# Patient Record
Sex: Female | Born: 1970 | Hispanic: Yes | Marital: Single | Smoking: Never smoker
Health system: Southern US, Community
[De-identification: ages and names within clinical notes are randomized; demographics above are authoritative.]

## PROBLEM LIST (undated history)

## (undated) DIAGNOSIS — Z9889 Other specified postprocedural states: Secondary | ICD-10-CM

## (undated) DIAGNOSIS — E785 Hyperlipidemia, unspecified: Secondary | ICD-10-CM

## (undated) DIAGNOSIS — D649 Anemia, unspecified: Secondary | ICD-10-CM

## (undated) DIAGNOSIS — D259 Leiomyoma of uterus, unspecified: Secondary | ICD-10-CM

## (undated) DIAGNOSIS — N920 Excessive and frequent menstruation with regular cycle: Secondary | ICD-10-CM

## (undated) DIAGNOSIS — N9489 Other specified conditions associated with female genital organs and menstrual cycle: Secondary | ICD-10-CM

## (undated) DIAGNOSIS — R7989 Other specified abnormal findings of blood chemistry: Secondary | ICD-10-CM

## (undated) DIAGNOSIS — Z8742 Personal history of other diseases of the female genital tract: Secondary | ICD-10-CM

## (undated) DIAGNOSIS — R112 Nausea with vomiting, unspecified: Secondary | ICD-10-CM

## (undated) HISTORY — DX: Hyperlipidemia, unspecified: E78.5

## (undated) HISTORY — PX: BOWEL RESECTION: SHX1257

## (undated) HISTORY — PX: TUBAL LIGATION: SHX77

## (undated) HISTORY — PX: KNEE SURGERY: SHX244

## (undated) HISTORY — PX: SHOULDER SURGERY: SHX246

---

## 2011-11-05 ENCOUNTER — Ambulatory Visit (INDEPENDENT_AMBULATORY_CARE_PROVIDER_SITE_OTHER): Payer: BC Managed Care – PPO | Admitting: Gynecology

## 2011-11-05 ENCOUNTER — Other Ambulatory Visit (HOSPITAL_COMMUNITY)
Admission: RE | Admit: 2011-11-05 | Discharge: 2011-11-05 | Disposition: A | Discharge status: Home / Self Care | Payer: BC Managed Care – PPO | Source: Ambulatory Visit | Attending: Gynecology | Admitting: Gynecology

## 2011-11-05 ENCOUNTER — Encounter: Payer: Self-pay | Admitting: Gynecology

## 2011-11-05 VITALS — BP 118/76 | Ht 58.25 in | Wt 157.0 lb

## 2011-11-05 DIAGNOSIS — N949 Unspecified condition associated with female genital organs and menstrual cycle: Secondary | ICD-10-CM

## 2011-11-05 DIAGNOSIS — IMO0002 Reserved for concepts with insufficient information to code with codable children: Secondary | ICD-10-CM

## 2011-11-05 DIAGNOSIS — N941 Unspecified dyspareunia: Secondary | ICD-10-CM | POA: Insufficient documentation

## 2011-11-05 DIAGNOSIS — R635 Abnormal weight gain: Secondary | ICD-10-CM

## 2011-11-05 DIAGNOSIS — Z01419 Encounter for gynecological examination (general) (routine) without abnormal findings: Secondary | ICD-10-CM | POA: Insufficient documentation

## 2011-11-05 DIAGNOSIS — N938 Other specified abnormal uterine and vaginal bleeding: Secondary | ICD-10-CM

## 2011-11-05 DIAGNOSIS — Z23 Encounter for immunization: Secondary | ICD-10-CM

## 2011-11-05 DIAGNOSIS — N898 Other specified noninflammatory disorders of vagina: Secondary | ICD-10-CM

## 2011-11-05 DIAGNOSIS — Z1151 Encounter for screening for human papillomavirus (HPV): Secondary | ICD-10-CM | POA: Insufficient documentation

## 2011-11-05 DIAGNOSIS — Z833 Family history of diabetes mellitus: Secondary | ICD-10-CM | POA: Insufficient documentation

## 2011-11-05 LAB — CBC WITH DIFFERENTIAL/PLATELET
Basophils Relative: 1 % (ref 0–1)
Eosinophils Absolute: 0.8 10*3/uL — ABNORMAL HIGH (ref 0.0–0.7)
MCH: 24.6 pg — ABNORMAL LOW (ref 26.0–34.0)
MCHC: 33.7 g/dL (ref 30.0–36.0)
Neutro Abs: 5.8 10*3/uL (ref 1.7–7.7)
Neutrophils Relative %: 58 % (ref 43–77)
Platelets: 374 10*3/uL (ref 150–400)
RBC: 4.99 MIL/uL (ref 3.87–5.11)

## 2011-11-05 LAB — HEMOGLOBIN A1C
Hgb A1c MFr Bld: 5.9 % — ABNORMAL HIGH (ref <5.7)
Mean Plasma Glucose: 123 mg/dL — ABNORMAL HIGH (ref <117)

## 2011-11-05 MED ORDER — FLUCONAZOLE 100 MG PO TABS: ORAL_TABLET | ORAL | Status: DC

## 2011-11-05 NOTE — Patient Instructions (Addendum)
Vacuna difteria/ttanos (Td) o [place] difteria, ttanos, tos convulsa (Tdap), Lo que debe saber (Tetanus, Diphtheria [Td] or Tetanus, Diphtheria, Pertussis [Tdap] Vaccine, What You Need to Know) PORQU VACUNARSE? El ttanos , la difteria y la tos ferina pueden ser enfermedades graves.  El TTANOS  (trismo) provoca la contraccin dolorosa y rigidez de los msculos, por lo general, en todo el cuerpo.   Puede causar la contraccin de los msculos de la cabeza y el cuello de modo que el enfermo no puede abrir la boca ni tragar., y en algunos casos, tampoco puede respirar.. El ttanos causa la muerte de 1 de cada 5 personas que se infectan.  LA DIFTERIA produce la formacin de una membrana gruesa que cubre el fondo de la garganta.  Puede causar problemas respiratorios, parlisis, insuficiencia cardaca, e incluso la muerte.  El PERTUSIS (tos [place]) causa ataques de tos intensa que pueden dificultar la respiracin, provocar vmitos e interrumpir el sueo.   Puede causar prdida de peso, incontinencia, fractura de [place], y desmayos por la intensa tos. Hasta de 2 de cada 100 adolescentes y 5 de cada 100 adultos que enferman de tos [place] deben ser hospitalizados o tienen complicaciones como la neumona y la [place].  Estas 3 enfermedades son provocadas por bacterias. La difteria y la tos Benetta Spar se [place] de persona a Social worker. El ttanos ingresa al organismo a travs de cortes, rasguos o heridas. En los [place] ocurran alrededor de 200 000 casos por ao de difteria y tos [place], antes de que existieran las [place], y tambin ocurran cientos de casos de ttanos. Desde la aparicin de las vacunas, el ttanos y la difteria han disminuido en alrededor del 99% y los casos de tos ferina disminuyeron aproximadamente el 92%.  Los nios menores de 6 aos deben recibir la vacuna DTaP para estar protegidos contra estas tres enfermedades. Pero los [employer], los adolescentes y los adultos tambin  necesitan proteccin. VACUNAS PARA ADOLESCENTES Y ADULTOS Vacunas Tdap y Td  Hay dos vacunas disponibles para proteger de estas enfermedades a nios a Glass blower/designer de los 7aos:   La vacuna Td fue utilizada durante muchos aos. Protege contra el ttanos y la difteria.   La vacuna Tdap fue autorizada en 2005. Es la primera vacuna para adolescentes y adultos que protege contra la tos ferina y el ttanos y la difteria.  Una dosis de refuerzo de la Td se recomienda cada 10 aos. La Tdap se aplica slo una vez.  QU VACUNA DEBO APLICARME Y CUANDO? Las edades de 7 a 18 aos  Dynegy 11 y los 12 aos se recomienda una dosis de Tdap. Esta dosis puede aplicarse desde los 7 aos en los nios que no han recibido una o ms dosis de DTaP anteriormente.   Los nios y adolescentes que no recibieron todas las dosis programadas de DTaP o DTP a los 7 aos deben completar la serie usando una combinacin de Td y Tdap.  Adultos de 19 aos o ms  [employer] adultos deben recibir una dosis de refuerzo de Td cada 10 aos. Los adultos de menos de 65 aos que nunca hayan recibido la Tdap deben reemplazarla por la siguiente dosis de refuerzo. Los adultos a partir de los 65 aos puedenrecibir una dosis de Tdap.   Los adultos (incluyendo las mujeres que podran quedar embarazadas y los adultos mayores de 65 aos) que tienen contacto cercano con un beb menor de 12 meses deben aplicarse una dosis de Tdap para proteger al beb  de la tos [place].   Los trabajadores de la salud que tengan contacto directo con pacientes en hospitales o clnicas deben recibir una dosis de Tdap.  Proteccin despus de [place] herida  Es posible que una persona que tenga un corte o quemadura grave necesite una dosis de Td o Tdap para prevenir la infeccin por ttanos. Puede usarse la Tdap en personas que nunca recibieron una dosis. Pero debe usarse la Td, si la Tdap no se encuentra disponible, o para:   Cualquier persona que haya recibido una dosis de  Tdap.   Los nios [employer] 7 y los 9 aos que han [employer] series de DTap anteriormente.   Adultos de 65 aos o ms.  Mujeres embarazadas.   Las mujeres embarazadas que nunca recibieron una dosis de Ddap deben recibirla despus de la 20a semana de gestacin y preferiblemente durante Contractor. trimestre. Si no se aplican la Tdap durante el embarazo, deben recibirla lo antes posible despus del parto. Las mujeres embarazadas que han recibido la Tdap y tienen que aplicarse la vacuna contra el ttanos o la difteria durante el [place], deben recibir la Td.  Las vacunas Tdap y Td pueden ser administradas al mismo tiempo que otras vacunas. ALGUNAS PERSONAS NO DEBEN RECIBIR LA VACUNA O DEBEN [employer]  Las personas que hayan tenido una reaccin alrgica que haya puesto en peligro su vida despus de una dosis de vacuna contra el ttanos, la difteria o la tos ferina no deben recibir Td ni Tdap..   Las personas que tengan alergias graves a algn componente de una vacuna no deben recibir esa vacuna. Informe a su mdico si la persona que recibe la vacuna sufre alergias graves.   Cualquier persona que [employer] Companies en coma o que haya tenido convulsiones dentro de los 7 [address]  [address] posteriores despus de una dosis de DTP o DTaP no debe recibir la Tdap, salvo que se encuentre una causa que no fuera la vacuna. Estas personas pueden recibir Td.   Consulte a su mdico si la persona que recibe [place] de las vacunas:   Tiene epilepsia o algn otro problema del sistema nervioso.   Tuvo inflamacin o dolor intenso despus de una dosis de DTP, DTaP, DT, Td, o Tdap.   Ha tenido el sndrome de Scientific laboratory technician (GBS por sus siglas en ingls).  Las personas que sufran una enfermedad moderada o grave el da en que se programa la vacuna, deben esperar a recuperarse para recibir las vacunas Tdap o Td. Por lo general, una persona con una enfermedad leve o fiebre baja puede recibir la vacuna. CULES SON LOS RIESGOS DE LAS VACUNAS  TDAP Y TD? Con Cathleen Corti, al igual que con cualquier [employer], siempre existe un pequeo riesgo de una reaccin alrgica que ponga en peligro la vida o cause otro problema grave. Todo procedimiento mdico, inclusive la vacunacin pueden causar breves episodios de lipotimia o sntomas relacionados (como movimientos espasmdicos). Para evitar los [employer] y las lesiones causadas por las cadas, permanezca sentado o recustese durante los 15 minutos posteriores a la vacunacin. Informe a su mdico si el paciente se siente dbil o mareado, tiene cambios en la visin o siente zumbidos en los odos.  Es mucho ms probable que tener ttanos, difteria, o tos ferina cause problemas ms graves que los provocados por recibir cualquiera de las vacunas Td o Tdap. A continuacin se enumeran los problemas informados despus de las vacunas Td y Tdap. Problemas Leves (perceptibles, pero que no interfirieron con las  actividades): Tdap  Dolor (alrededor de 3 de cada 4 adolescentes y 2 de cada 3 adultos).   Enrojecimiento o inflamacin en el sitio de la inyeccin (alrededor de 1 de cada 5).   Fiebre leve de al menos 100.4 F (38 C) (hasta alrededor de 1 cada 25 adolescentes y 1 de cada 100 adultos).   Dolor de cabeza (alrededor de 4 de cada 10 adolescentes y 3 de cada 10 adultos).   Cansancio (alrededor de 1 de cada 3 adolescentes y 1 de cada 4 adultos).   Nuseas, vmitos, diarrea, o dolor de estmago (hasta 1 de cada 4 adolescentes y 1 de cada 10 adultos).   Escalofros, dolores corporales, dolor articular, erupciones, o inflamacin de las glndulas (poco frecuente).  Td  Dolor (hasta alrededor de 8 de cada 10).   Enrojecimiento o inflamacin de la inyeccin (alrededor de 1 de cada 3).   Fiebre leve (hasta alrededor de 1 de cada 5).   Dolor de cabeza o cansancio (poco frecuente).  Problemas Moderados (interfieren con las [place], [place] no requieren atencin mdica): Tdap  Dolor en el sitio de  la inyeccin (alrededor de 1 de cada 20 adolescentes y 1 de cada 100 adultos).   Enrojecimiento o inflamacin de la inyeccin (alrededor de 1 de cada 16 adolescentes y 1 de cada 25 adultos).   Fiebre de ms de 102 F (38.9 C) (alrededor de 1 de cada 100 adolescentes y 1 de cada 250 adultos).   Dolor de cabeza (1 de cada 300).   Nuseas, vmitos, diarrea, o dolor de estmago (hasta 3 de cada 100 adolescentes y 1 de cada 100 adultos).  Td  Fiebre de ms de 102 F (38.9 C) (poco comn).  Tdap o Td  Inflamacin de gran extensin en el brazo en el que se aplic la vacuna (hasta 3 de cada 100).  Problemas [place] (no puede realizar [employer]; requiere Psychologist, prison and probation services) Tdap o Td  Inflamacin, dolor intenso, sangrado y enrojecimiento en el brazo, en el sitio de la inyeccin (poco frecuente).  Puede producirse una reaccin alrgica grave despus de cualquier vacuna. Se estima que estas reacciones ocurren en menos de una de cada un milln de dosis. QU PASA SI HAY UNA REACCIN GRAVE? Qu signos debo buscar? Cualquier estado poco habitual, como una reaccin alrgica grave o fiebre alta. Si le produce Runner, broadcasting/film/video grave, se manifestar dentro de algunos minutos a una hora despus de recibir la vacuna. Entre los signos de Automotive engineer grave se encuentran la dificultad para respirar, debilidad, ronquera o sibilancias, latidos cardacos acelerados, urticaria, mareos, palidez, o inflamacin de la garganta. Qu debo hacer?  Comunquese con su mdico o lleve inmediatamente a la persona al mdico.   Dgale a su mdico qu ocurri, la fecha y hora en que sucedi y cundo le aplicaron la vacuna.   Pida a su mdico que informe sobre la reaccin llenando un formulario del Sistema de Informacin de Reacciones Adversos a las Administrator, arts (VAERS, por sus siglas en ingls). O, puede presentar este informe a travs del sitio web de VAERS [email or web address] o puede llamar al  [phone].  VAERS no brinda asistencia mdica. PROGRAMA NACIONAL DE COMPENSACIN DE DAOS POR VACUNAS El [place] de Compensacin de Daos por Vacunas (VICP) fue creado en 1986.  Aquellas personas que consideren que han sufrido un dao como consecuencia de una vacuna y quieren saber ms acerca del programa y como presentar [place], [place] llamar al [phone] o visitar su sitio web  en [email or web address]  CMO PUEDO OBTENER MS INFORMACIN?  El profesional podr darle el prospecto de la vacuna o sugerirle otras fuentes de informacin.   Comunquese con el servicio de salud de su localidad o [address].   Comunquese con los Centros para el control y la prevencin de Child psychotherapist for Disease Control and Prevention , CDC).   Llame al [phone] (1-800-CDC-INFO).   Visite los sitios web de Energy [employer] Partners en [email or web address]  CDC Td and Tdap Interim VIS-Spanish (02/05/10) Document Released: 04/17/2008 Document Revised: 12/19/2010 [facility] Patient Information 2012 [place], [place]. Ecografa transvaginal (Transvaginal Ultrasound) La ecografa transvaginal es una ecografa plvica en la que se utiliza una probeta metlica que se coloca en la vagina, para observar los rganos femeninos. El ecgrafo enva ondas sonoras desde un transductor (sonda). Estas ondas sonoras chocan contra las estructuras del cuerpo (como un eco) y crean Naval architect. La imagen se observa en un monitor. Se denomina transvaginal debido a que la sonda se inserta dentro de la vagina. Puede haber una pequea molestia por la introduccin de la sonda. Esta prueba tambin puede realizarse [employer]. La ecografa endovaginal es otro nombre para la ecografa transvaginal. En una ecografa transabdominal, la sonda se coloca en la parte externa del abdomen. Este mtodo no ofrece imgenes tan buenas como la tcnica transvaginal. La ecogafa transvaginal se utiliza para observar  alteraciones en el tracto genital femenino. Entre ellos se incluyen:  Problemas de infertilidad.   Malformaciones congnitas (defecto de nacimiento) del tero y los ovarios.   Tumores en el tero.   Hemorragias anormales.   Tumores y quistes de ovario.   Abscesos (tejidos inflamados y pus) en la pelvis.   Dolor abdominal o plvico sin causa aparente.   Infecciones plvicas.  DURANTE EL EMBARAZO, SE UTILIZA PAR OBSERVAR:  Embarazos normales.   Un embarazo ectpico (embarazo fuera del tero).   Latidos cardacos fetales.   Anormalidades de la pelvis que no se observan bien con la ecografa transabdominal.   Sospecha de gemelos o embarazo mltiple.   Aborto inminente.   Problemas en el cuello del tero (cuello incompetente, no permanece cerrado para contener al beb).   Cuando se realiza una amniocentesis (se retira lquido de la bolsa [place], para ser [place]).   Al buscar anormalidades en el beb.   Para controlar el crecimiento, el desarrollo y la edad del feto.   Para medir la cantidad de lquido en el saco amnitico.   Cuando se realiza una versin externa del beb (se lo mueve a Loss adjuster, chartered).   Evaluar al beb en embarazos de alto riesgo (perfil biofsico).   Si se sospecha el deceso del beb (muerte).  En algunos casos, se utiliza un mtodo especial denominado ecografa con infusin salina, para una observacin ms precisa del tero. Se inyecta solucin salina (agua con sal) dentro del tero en pacientes no embarazadas para observar mejor su interior. Este mtodo no se Glass blower/designer. La probeta tambin puede usarse para obtener biopsias de Insurance claims handler, para drenar lquido de quistes de ovario y para Editor, commissioning un DIU (dispositivo intrauterino para el control de la natalidad) que no pueda [place]. PREPARACIN PARA LA PRUEBA La ecografa transvaginal se realiza con la vejiga vaca. La ecografa transabdominal se realiza con la vejiga  llena. Podrn solicitarle que beba varios vasos de agua antes del examen. En algunos casos se realiza una ecografa transabdominal antes de la ecografa transvaginal para obervar los rganos del abdomen. PROCEDIMIENTO  Deber acostarse en  una cama, con las rodillas dobladas y los pies en los estribos. La probeta se cubre con un condn. Dentro de la vagina y en la probeta se aplica un lubricante estril. El lubricante ayuda a transmitir las ondas sonoras y Nature conservation officer la irritacin de la vagina. El mdico mover la sonda en el interior de la cavidad vaginal para escanear las estructuras plvicas. Un examen normal mostrar una pelvis normal y contenidos normales en su interior. Una prueba anormal mostrar anormalidades en la pelvis, la placenta o el beb. LAS CAUSAS DE UN RESULTADO ANORMAL PUEDEN SER:  Crecimientos o tumores en:   El tero.   Los ovarios.   La vagina.   Otras estructuras plvicas.   Crecimientos no cancerosos en el tero y los ovarios.   El ovario se retuerce y se corta el suministro de Montez Hageman (torsin [place]).   Las reas de infeccin incluyen:   Enfermedad inflamatoria plvica.   Un absceso en la pelvis.   Ubicacin de un DIU.  LOS PROBLEMAS QUE PUEDEN HALLARSE EN UNA MUJER EMBARAZADA SON:  Embarazo ectpico (embarazo fuera del tero).   Embarazos mltiples.   Dilatacin (apertura) precoz anormal del cuello del tero. Esto puede indicar un cuello incompetente y Surveyor, mining.   Aborto inminente.   Muerte fetal.   Los problemas con la placenta incluyen:   La placenta se ha desarrollado sobre la abertura del cuello del tero (placenta previa).   La placenta se ha separado anticipadamente en el tero (abrupcin placentaria).   La placenta se desarrolla en el msculo del tero (placenta acreta).   Tumores del Psychiatrist, incluyendo la enfermedad trofoblstica gestacional. Se trata de un embarazo anormal en el que no hay feto. El tero se llena de quistes  similares a uvas que en algunos casos son cancerosos.   Posicin incorrecta del feto (de nalgas, de vrtice).   Retraso del desarrollo fetal intrauterino (escaso desarrollo en el tero).   Anormalidades o infeccin fetal.  RIESGOS Y COMPLICACIONES No hay riesgos conocidos para la ecografa. No se toman radiografas cuando se realiza una ecografa. Document Released: 04/17/2008 Document Revised: 12/19/2010 [facility] Patient Information 2012 [place], [place].   Vulvovaginitis Candidisica (Candidal Vulvovaginitis) La vulvovaginitis candidisica es una infeccin de la vagina y la vulva. La vulva es la piel que rodea la abertura de la vagina. Puede causar picazn y [place] de y alrededor de la vagina.  CUIDADOS EN EL HOGAR  Slo tome medicamentos como lo indique su mdico.  No mantenga relaciones sexuales hasta que la infeccin haya curado o segn le indique el mdico.  Practique sexo seguro.  Informe a su compaero sexual acerca de su infeccin.  No tome duchas vaginales ni use tampones.  Use ropa interior de algodn. No utilice pantalones ni pantimedias ajustados.  Coma yogur. Esto puede ayudar a tratar y prevenir las infecciones por cndida. SOLICITE AYUDA DE INMEDIATO SI:   Tiene fiebre.  Los problemas empeoran durante el tratamiento, o si no mejora luego de [address].  Tiene malestar, irritacin, o picazn en la zona de la vagina o la vulva.  Siente dolor en al [employer]  Comienza a sentir dolor abdominal. ASEGRESE DE QUE:  Comprende estas instrucciones.  Controlar su enfermedad.  Solicitar ayuda de inmediato si no mejora o empeora. Document Released: 02/01/2010 Document Revised: 03/24/2011 [facility] Patient Information 2013 [place], [place].

## 2011-11-05 NOTE — Progress Notes (Signed)
Helen Mccarthy 11/26/1970 [id number]   History:    41 y.o.  for annual gyn exam who is new to the practice. Patient states she's had 4 prior cesarean sections the last one resulting in a tubal sterilization procedure. She has not had a Pap smear in over 3 years. She denies any prior history of abnormal Pap smears. Patient with no prior mammograms done. Patient does her monthly self breast examination. Patient complaining of a slight vaginal discharge today. Patient states that over the past year her periods are lasting about 3-4 days comes and and and and then a week or 10 days later she starts having a brownish-like discharge. She also complaining of dyspareunia.  Past medical history,surgical history, family history and social history were all reviewed and documented in the EPIC chart.  Gynecologic History Patient's last menstrual period was 10/12/2011. Contraception: tubal ligation Last Pap: 3 years ago. Results were: Normal? Last mammogram: No prior study. Results were: No prior study  Obstetric History OB History    Grav Para Term Preterm Abortions TAB SAB Ect Mult Living   4 4 4       4      # Outc Date GA Lbr Len/2nd Wgt Sex Del Anes PTL Lv   1 TRM     M CS  No Yes   Comments: DECEASED THREE DAYS AFTER BEING BORN-    2 TRM     M CS  No Yes   3 TRM     F CS  No Yes   4 TRM     F CS  No Yes       ROS: A ROS was performed and pertinent positives and negatives are included in the history.  GENERAL: No fevers or chills. HEENT: No change in vision, no earache, sore throat or sinus congestion. NECK: No pain or stiffness. CARDIOVASCULAR: No chest pain or pressure. No palpitations. PULMONARY: No shortness of breath, cough or wheeze. GASTROINTESTINAL: No abdominal pain, nausea, vomiting or diarrhea, melena or bright red blood per rectum. GENITOURINARY: No urinary frequency, urgency, hesitancy or dysuria. MUSCULOSKELETAL: No joint or muscle pain, no back pain, no recent trauma.  DERMATOLOGIC: No rash, no itching, no lesions. ENDOCRINE: No polyuria, polydipsia, no heat or cold intolerance. No recent change in weight. HEMATOLOGICAL: No anemia or easy bruising or bleeding. NEUROLOGIC: No headache, seizures, numbness, tingling or weakness. PSYCHIATRIC: No depression, no loss of interest in normal activity or change in sleep pattern.     Exam: chaperone present  BP 118/76  Ht 4' 10.25" (1.48 m)  Wt 157 lb (71.215 kg)  BMI 32.53 kg/m2  LMP 10/12/2011  Body mass index is 32.53 kg/(m^2).  General appearance : Well developed well nourished female. No acute distress HEENT: Neck supple, trachea midline, no carotid bruits, no thyroidmegaly Lungs: Clear to auscultation, no rhonchi or wheezes, or rib retractions  Heart: Regular rate and rhythm, no murmurs or gallops Breast:Examined in sitting and supine position were symmetrical in appearance, no palpable masses or tenderness,  no skin retraction, no nipple inversion, no nipple discharge, no skin discoloration, no axillary or supraclavicular lymphadenopathy Abdomen: no palpable masses or tenderness, no rebound or guarding Extremities: no edema or skin discoloration or tenderness  Pelvic:  Bartholin, Urethra, Skene Glands: Within normal limits             Vagina: No gross lesions, slight white discharge  Cervix: No gross lesions or discharge  Uterus  anteverted slightly irregular, 8-10 week size?  Adnexa  Without masses or tenderness  Anus and perineum  normal   Rectovaginal  normal sphincter tone without palpated masses or tenderness             Hemoccult not done   Wet prep: Moderate amount of yeast  Assessment/Plan:  41 y.o. female for annual exam with evidence of moniliasis. She will be given a prescription of Diflucan 100 mg to take 1 by mouth today. Patient will return back to the office next week for sonohysterogram and endometrial biopsy. Labs drawn today: Hemoglobin A1c, TSH, screening cholesterol, urinalysis,  and Pap smear. She will receive the Tdap vaccine today. Literature information was provided in [place]. She was encouraged to do her monthly self breast examination. When she returned back next week for sonohysterogram we will give her requisition for her to schedule her mammogram.    Ok Edwards MD, 3:27 PM 11/05/2011

## 2011-11-06 LAB — TSH: TSH: 3.497 u[IU]/mL (ref 0.350–4.500)

## 2011-11-07 ENCOUNTER — Other Ambulatory Visit: Payer: Self-pay | Admitting: Anesthesiology

## 2011-11-07 DIAGNOSIS — E7800 Pure hypercholesterolemia, unspecified: Secondary | ICD-10-CM

## 2011-11-07 DIAGNOSIS — R739 Hyperglycemia, unspecified: Secondary | ICD-10-CM

## 2011-11-11 ENCOUNTER — Other Ambulatory Visit: Payer: Self-pay | Admitting: Anesthesiology

## 2011-11-11 MED ORDER — FLUCONAZOLE 100 MG PO TABS: ORAL_TABLET | ORAL | Status: DC

## 2011-11-12 ENCOUNTER — Other Ambulatory Visit: Payer: BC Managed Care – PPO

## 2011-11-12 DIAGNOSIS — E7800 Pure hypercholesterolemia, unspecified: Secondary | ICD-10-CM

## 2011-11-12 DIAGNOSIS — R739 Hyperglycemia, unspecified: Secondary | ICD-10-CM

## 2011-11-13 LAB — LIPID PANEL
HDL: 54 mg/dL (ref >39)
LDL Cholesterol: 171 mg/dL — ABNORMAL HIGH (ref 0–99)
Total CHOL/HDL Ratio: 4.5 Ratio
Triglycerides: 82 mg/dL (ref <150)
VLDL: 16 mg/dL (ref 0–40)

## 2011-11-14 ENCOUNTER — Other Ambulatory Visit: Payer: Self-pay | Admitting: Gynecology

## 2011-11-14 ENCOUNTER — Ambulatory Visit (INDEPENDENT_AMBULATORY_CARE_PROVIDER_SITE_OTHER): Payer: BC Managed Care – PPO

## 2011-11-14 ENCOUNTER — Ambulatory Visit (INDEPENDENT_AMBULATORY_CARE_PROVIDER_SITE_OTHER): Payer: BC Managed Care – PPO | Admitting: Gynecology

## 2011-11-14 DIAGNOSIS — E785 Hyperlipidemia, unspecified: Secondary | ICD-10-CM | POA: Insufficient documentation

## 2011-11-14 DIAGNOSIS — D259 Leiomyoma of uterus, unspecified: Secondary | ICD-10-CM

## 2011-11-14 DIAGNOSIS — N949 Unspecified condition associated with female genital organs and menstrual cycle: Secondary | ICD-10-CM

## 2011-11-14 DIAGNOSIS — N938 Other specified abnormal uterine and vaginal bleeding: Secondary | ICD-10-CM

## 2011-11-14 DIAGNOSIS — N946 Dysmenorrhea, unspecified: Secondary | ICD-10-CM

## 2011-11-14 DIAGNOSIS — R7303 Prediabetes: Secondary | ICD-10-CM | POA: Insufficient documentation

## 2011-11-14 DIAGNOSIS — R7309 Other abnormal glucose: Secondary | ICD-10-CM

## 2011-11-14 DIAGNOSIS — IMO0002 Reserved for concepts with insufficient information to code with codable children: Secondary | ICD-10-CM

## 2011-11-14 NOTE — Progress Notes (Signed)
Patient is a 41 year old who was seen in the office in a new patient 11/05/2011. Please see previous encounter. Patient with past history tubal sterilization procedure. Patient with 4 prior cesarean section. She has not had a gynecological examination and Pap smear in over 3 years. Her recent Pap smear in the office was normal. She denied any prior abnormal Pap smear. Patient states that over the past year her periods are lasting about 3-4 days comes and and and and then a week or 10 days later she starts having a brownish-like discharge. She also complaining of dyspareunia. She presented to the office today for sonohysterogram and endometrial biopsy.  Procedure note: The vagina and cervix was cleansed with Betadine solution. There was slight difficulty in inserting the catheter for this on infusion histogram. The cervix required dilatation at first. Sonohysterogram demonstrated no intracavitary defect cesarean section scar was noted. She had several small intramural fibroids total number were 5 the largest one measuring 23 x 15 mm. The ovaries appeared normal otherwise. The uterus measured 9.9 x 6.8 x 5.6 cm with endometrial stripe of 7 mm.  In a sterile fashion a Pipelle was introduced into the intrauterine cavity and tissue was obtained for endometrial biopsy and submitted for histological evaluation.  On further discussion with the patient she states her cycles are usually regular so we are going to ask her to maintain a calendar of her menses over the course of the next 6 months. Since she was found to have hypercholesterolemia and was placed on cholesterol-lowering diet along with regular exercise program and she will return to the office in 6 months for followup visit along with a fasting lipid profile and fasting blood sugar ( prediabetes/hemoglobin A1c 5.9) . All the above was explained in [place].

## 2011-11-17 ENCOUNTER — Other Ambulatory Visit: Payer: Self-pay | Admitting: Gynecology

## 2012-07-07 ENCOUNTER — Ambulatory Visit: Payer: Self-pay | Admitting: Gynecology

## 2012-08-04 ENCOUNTER — Encounter: Payer: Self-pay | Admitting: Gynecology

## 2012-08-04 ENCOUNTER — Ambulatory Visit (INDEPENDENT_AMBULATORY_CARE_PROVIDER_SITE_OTHER): Payer: BC Managed Care – PPO | Admitting: Gynecology

## 2012-08-04 VITALS — BP 128/78

## 2012-08-04 DIAGNOSIS — N930 Postcoital and contact bleeding: Secondary | ICD-10-CM

## 2012-08-04 DIAGNOSIS — D259 Leiomyoma of uterus, unspecified: Secondary | ICD-10-CM

## 2012-08-04 NOTE — Progress Notes (Signed)
The patient presented to the office today for a six-month followup.Patient with past history tubal sterilization procedure. Patient with 4 prior cesarean section. She has not had a gynecological examination and Pap smear in over 3 years. Her recent Pap smear in the office was normal. She denied any prior abnormal Pap smear. Patient states that over the past year her periods are lasting about 3-4 days comes and and and and then a week or 10 days later she starts having a brownish-like discharge.on the office visit November 2013 patient had a normal endometrial biopsy. Patientalso had a normal TSH and blood sugar and CBC. Ultrasound last year:  Sonohysterogram demonstrated no intracavitary defect cesarean section scar was noted. She had several small intramural fibroids total number were 5 the largest one measuring 23 x 15 mm. The ovaries appeared normal otherwise. The uterus measured 9.9 x 6.8 x 5.6 cm with endometrial stripe of 7 mm.   2013 normal Pap smear   Patient states that since that time her cycles have been regular. She stated that last month after intercourse she noticed some bleeding very light. Patient states that sometimes she feels dry during intercourse. She states that douches at times. Patient is currently on her menstrual cycle.  Pelvic exam: Bartholin urethra Skene was within normal limits Vagina: Menstrual blood present no lesions seen Cervix: Menstrual blood Uterus anteverted normal size shape and consistency Adnexa: No palpable mass or tenderness  Assessment/plan:patient cycles have been reported to be regular. Isolated event of postcoital bleeding. I've encouraged her to stop douching. I'm going to recommend that she uses [place] probiotic gel twice a week or after intercourse and after her menses. She will maintain a menstrual calendar for the next several months and bring it to the office when she comes sees me for her annual exam first week in November.

## 2012-11-10 ENCOUNTER — Telehealth: Payer: Self-pay | Admitting: *Deleted

## 2012-11-10 ENCOUNTER — Ambulatory Visit (INDEPENDENT_AMBULATORY_CARE_PROVIDER_SITE_OTHER): Payer: BC Managed Care – PPO | Admitting: Gynecology

## 2012-11-10 ENCOUNTER — Encounter: Payer: Self-pay | Admitting: Gynecology

## 2012-11-10 VITALS — BP 118/76 | Ht <= 58 in | Wt 157.4 lb

## 2012-11-10 DIAGNOSIS — N644 Mastodynia: Secondary | ICD-10-CM

## 2012-11-10 DIAGNOSIS — N63 Unspecified lump in unspecified breast: Secondary | ICD-10-CM

## 2012-11-10 DIAGNOSIS — R635 Abnormal weight gain: Secondary | ICD-10-CM

## 2012-11-10 DIAGNOSIS — Z01419 Encounter for gynecological examination (general) (routine) without abnormal findings: Secondary | ICD-10-CM

## 2012-11-10 LAB — CBC WITH DIFFERENTIAL/PLATELET
Basophils Relative: 1 % (ref 0–1)
Eosinophils Absolute: 0.9 10*3/uL — ABNORMAL HIGH (ref 0.0–0.7)
Eosinophils Relative: 9 % — ABNORMAL HIGH (ref 0–5)
Hemoglobin: 12.3 g/dL (ref 12.0–15.0)
Lymphs Abs: 2.4 10*3/uL (ref 0.7–4.0)
MCH: 24.1 pg — ABNORMAL LOW (ref 26.0–34.0)
MCHC: 34.2 g/dL (ref 30.0–36.0)
MCV: 70.5 fL — ABNORMAL LOW (ref 78.0–100.0)
Monocytes Absolute: 0.8 10*3/uL (ref 0.1–1.0)
Monocytes Relative: 8 % (ref 3–12)
Neutrophils Relative %: 58 % (ref 43–77)
Platelets: 408 10*3/uL — ABNORMAL HIGH (ref 150–400)

## 2012-11-10 LAB — COMPREHENSIVE METABOLIC PANEL
Albumin: 4.4 g/dL (ref 3.5–5.2)
Alkaline Phosphatase: 68 U/L (ref 39–117)
CO2: 24 mEq/L (ref 19–32)
Creat: 0.55 mg/dL (ref 0.50–1.10)
Glucose, Bld: 78 mg/dL (ref 70–99)
Potassium: 3.8 mEq/L (ref 3.5–5.3)
Sodium: 134 mEq/L — ABNORMAL LOW (ref 135–145)
Total Bilirubin: 0.4 mg/dL (ref 0.3–1.2)
Total Protein: 7.7 g/dL (ref 6.0–8.3)

## 2012-11-10 LAB — LIPID PANEL
Cholesterol: 244 mg/dL — ABNORMAL HIGH (ref 0–200)
HDL: 58 mg/dL (ref >39)
LDL Cholesterol: 165 mg/dL — ABNORMAL HIGH (ref 0–99)
Total CHOL/HDL Ratio: 4.2 Ratio
Triglycerides: 105 mg/dL (ref <150)
VLDL: 21 mg/dL (ref 0–40)

## 2012-11-10 NOTE — Patient Instructions (Signed)
Control del colesterol  Los niveles de colesterol en el organismo estn determinados significativamente por su dieta. Los niveles de colesterol tambin se relacionan con la enfermedad cardaca. El material que sigue ayuda a explicar esta relacin y a analizar qu puede hacer para mantener su corazn sano. No todo el colesterol es malo. Las lipoprotenas de baja densidad (LDL) forman el colesterol "malo". El colesterol malo puede ocasionar depsitos de grasa que se acumulan en el interior de las arterias. Las lipoprotenas de alta densidad (HDL) es el colesterol "bueno". Ayuda a remover el colesterol LDL "malo" de la sangre. El colesterol es un factor de riesgo muy importante para la enfermedad cardaca. Otros factores de riesgo son la hipertensin arterial, el hbito de fumar, el estrs, la herencia y el peso.   El msculo cardaco obtiene el suministro de sangre a travs de las arterias coronarias. Si su colesterol LDL ("malo") est elevado y el HDL ("bueno") es bajo, tiene un factor de riesgo para que se formen depsitos de grasa en las arterias coronarias (los vasos sanguneos que suministran sangre al corazn). Esto hace que haya menos lugar para que la sangre circule. Sin la suficiente sangre y oxgeno, el msculo cardaco no puede funcionar correctamente, y usted podr sentir dolores en el pecho (angina pectoris). Cuando una arteria coronaria se cierra completamente, una parte del msculo cardaco puede morir (infarto de miocardio).  CONTROL DEL COLESTEROL Cuando el profesional que lo asiste enva la sangre al laboratorio para conocer el nivel de colesterol, puede realizarle tambin un perfil completo de los lpidos. Con esta prueba, se puede determinar la cantidad total de colesterol, as como los niveles de LDL y HDL. Los triglicridos son un tipo de grasa que circula en la sangre y que tambin puede utilizarse para determinar el riesgo de enfermedad  cardaca. En la siguiente tabla se establecen los nmeros ideales: Prueba: Colesterol total  Menos de 200 mg/dl.  Prueba: LDL "colesterol malo"  Menos de 100 mg/dl.   Menos de 70 mg/dl si tiene riesgo muy elevado de sufrir un ataque cardaco o muerte cardaca sbita.  Prueba: HDL "colesterol bueno"  Mujeres: Ms de 50 mg/dl.   Hombres: Ms de 40 mg/dl.  Prueba: Trigliceridos  Menos de 150 mg/dl.    CONTROL DEL COLESTEROL CON DIETA Aunque factores como el ejercicio y el estilo de vida son importantes, la "primera lnea de ataque" es la dieta. Esto se debe a que se sabe que ciertos alimentos hacen subir el colesterol y otros lo bajan. El objetivo debe ser equilibrar los alimentos, de modo que tengan un efecto sobre el colesterol y, an ms importante, reemplazar las grasas saturadas y trans con otros tipos de grasas, como las monoinsaturadas y las poliinsaturadas y cidos grasos omega-3 . En promedio, una persona no debe consumir ms de 15 a 17 g de grasas saturadas por da. Las grasas saturadas y trans se consideran grasas "malas", ya que elevan el colesterol LDL. Las grasas saturadas se encuentran principalmente en productos animales como carne, manteca y crema. Pero esto no significa que usted debe sacrificar todas sus comidas favoritas. Actualmente, como lo muestra el cuadro que figura al final de este documento, hay sustitutos de buen sabor, bajos en grasas y en colesterol, para la mayora de los alimentos que a usted le gusta comer. Elija aquellos alimentos alternativos que sean bajos en grasas o sin grasas. Elija cortes de carne del cuarto trasero o lomo ya que estos cortes son los que tienen menor cantidad de grasa   y colesterol. El pollo (sin piel), el pescado, la carne de ternera, y la pechuga de pavo molida son excelentes opciones. Elimine las carnes grasosas como los hotdogs o el salami. Los mariscos tienen poco o nada de grasas saturadas. Cuando consuma carne magra, carne de aves de  corral, o pescado, hgalo en porciones de 85 gramos (3 onzas). Las grasas trans tambin se llaman "aceites parcialmente hidrogenados". Son aceites manipulados cientficamente de modo que son slidos a temperatura ambiente, tienen una larga vida y mejoran el sabor y la textura de los alimentos a los que se agregan. Las grasas trans se encuentran en la margarina, masitas, crackers y alimentos horneados.  Para hornear y cocinar, el aceite es un excelente sustituto para la mantequilla. Los aceites monoinsaturados tienen un beneficio particular, ya que se cree que disminuyen el colesterol LDL (colesterol malo) y elevan el HDL. Deber evitar los aceites tropicales saturados como el de coco y el de palma.  Recuerde, adems, que puede comer sin restricciones los grupos de alimentos que son naturalmente libres de grasas saturadas y grasas trans, entre los que se incluyen el pescado, las frutas (excepto el aguacate), verduras, frijoles, cereales (cebada, arroz, cuzcuz, trigo) y las pastas (sin salsas con crema)   IDENTIFIQUE LOS ALIMENTOS QUE DISMINUYEN EL COLESTEROL  Pueden disminuir el colesterol las fibras solubles que estn en las frutas, como las manzanas, en los vegetales como el brcoli, las patatas y las zanahorias; en las legumbres como frijoles, guisantes y lentejas; y en los cereales como la cebada. Los alimentos fortificados con fitosteroles tambin pueden disminuir el colesterol. Debe consumir al menos 2 g de estos alimentos a diario para obtener el efecto de disminucin de colesterol.  En el supermercado, lea las etiquetas de los envases para identificar los alimentos bajos en grasas saturadas, libres de grasas trans y bajos en grasas, . Elija quesos que tengan solo de 2 a 3 g de grasa saturada por onza (28,35 g). Use una margarina que no dae el corazn, libre de grasas trans o aceite parcialmente hidrogenado. Al comprar alimentos horneados (galletitas dulces y galletas) evite el aceite parcialmente  hidrogenado. Los panes y bollos debern ser de granos enteros (harina de maz o de avena entera, en lugar de "harina" o "harina enriquecida"). Compre sopas en lata que no sean cremosas, con bajo contenido de sal y sin grasas adicionadas.   TCNICAS DE PREPARACIN DE LOS ALIMENTOS  Nunca fra los alimentos en aceite abundante. Si debe frer, hgalo en poco aceite y removiendo constantemente, porque as se utilizan muy pocas grasas, o utilice un spray antiadherente. Cuando le sea posible, hierva, hornee o ase las carnes y cocine los vegetales al vapor. En vez de aderezar los vegetales con mantequilla o margarina, utilice limn y hierbas, pur de manzanas y canela (para las calabazas y batatas), yogurt y salsa descremados y aderezos para ensaladas bajos en contenido graso.   BAJO EN GRASAS SATURADAS / SUSTITUTOS BAJOS EN GRASA  Carnes / Grasas saturadas (g)  Evite: Bife, corte graso (3 oz/85 g) / 11 g   Elija: Bife, corte magro (3 oz/85 g) / 4 g   Evite: Hamburguesa (3 oz/85 g) / 7 g   Elija:  [place] magra (3 oz/85 g) / 5 g   Evite: Jamn (3 oz/85 g) / 6 g   Elija:  Jamn magro (3 oz/85 g) / 2.4 g   Evite: Pollo, con piel (3 oz/85 g), Carne oscura / 4 g   Elija:  Pollo, sin piel (  3 oz/85 g), Carne oscura / 2 g   Evite: Pollo, con piel (3 oz/85 g), Carne magra / 2.5 g   Elija: Pollo, sin piel (3 oz/85 g), Carne magra / 1 g  Lcteos / Grasas saturadas (g)  Evite: Leche entera (1 taza) / 5 g   Elija: Leche con bajo contenido de grasa, 2% (1 taza) / 3 g   Elija: Leche con bajo contenido de grasa, 1% (1 taza) / 1.5 g   Elija: Leche descremada (1 taza) / 0.3 g   Evite: Queso duro (1 oz/28 g) / 6 g   Elija: Queso descremado (1 oz/28 g) / 2-3 g   Evite: Queso cottage, 4% grasa (1 taza)/ 6.5 g   Elija: Queso cottage con bajo contenido de grasa, 1% grasa (1 taza)/ 1.5 g   Evite: Helado (1 taza) / 9 g   Elija: Sorbete (1 taza) / 2.5 g   Elija: Yogurt helado sin contenido de  grasa (1 taza) / 0.3 g   Elija: Barras de fruta congeladas / vestigios   Evite: Crema batida (1 cucharada) / 3.5 g   Elija: Batidos glac sin lcteos (1 cucharada) / 1 g  Condimentos / Grasas saturadas (g)  Evite: Mayonesa (1 cucharada) / 2 g   Elija: Mayonesa con bajo contenido de grasa (1 cucharada) / 1 g   Evite: Manteca (1 cucharada) / 7 g   Elija: Margarina extra light (1 cucharada) / 1 g   Evite: Aceite de coco (1 cucharada) / 11.8 g   Elija: Aceite de oliva (1 cucharada) / 1.8 g   Elija: Aceite de maz (1 cucharada) / 1.7 g   Elija: Aceite de crtamo (1 cucharada) / 1.2 g   Elija: Aceite de girasol (1 cucharada) / 1.4 g   Elija: Aceite de soja (1 cucharada) / 2.4 g   Elija: Aceite de canola (1 cucharada) / 1 g  Document Released: 12/30/2004 Document Revised: 09/11/2010 [employer] Patient Information 2012 [employer]. Ejercicios para perder peso (Exercise to Lose Weight) La actividad fsica y una dieta saludable ayudan a perder peso. El mdico podr sugerirle ejercicios especficos. IDEAS Y CONSEJOS PARA HACER EJERCICIOS  Elija opciones econmicas que disfrute hacer , como caminar, andar en bicicleta o los vdeos para ejercitarse.   Utilice las escaleras en lugar del ascensor.   Camine durante la hora del almuerzo.   Estacione el auto lejos del lugar de trabajo o estudio.   Concurra a un gimnasio o tome clases de gimnasia.   Comience con 5  10 minutos de actividad fsica por da. Ejercite hasta 30 minutos, 4 a 6 das por semana.   Utilice zapatos que tengan un buen soporte y ropas cmodas.   Elongue antes y despus de ejercitar.   Ejercite hasta que aumente la respiracin y el corazn palpite rpido.   Beba agua extra cuando ejercite.   No haga ejercicio hasta lastimarse, sentirse mareado o que le falte mucho el aire.  La actividad fsica puede quemar alrededor de 150 caloras.  Correr 20 cuadras en 15 minutos.   Jugar vley durante 45 a 60  minutos.   Limpiar y encerar el auto durante 45 a 60 minutos.   Jugar ftbol americano de toque.   Caminar 25 cuadras en 35 minutos.   Empujar un cochecito 20 cuadras en 30 minutos.   Jugar baloncesto durante 30 minutos.   Rastrillar hojas secas durante 30 minutos.   Andar en bicicleta 80 cuadras en 30 minutos.     Caminar 30 cuadras en 30 minutos.   Bailar durante 30 minutos.   Quitar la nieve con una pala durante 15 minutos.   Nadar vigorosamente durante 20 minutos.   Subir escaleras durante 15 minutos.   Andar en bicicleta 60 cuadras durante 15 minutos.   Arreglar el jardn entre 30 y 45 minutos.   Saltar a la soga durante 15 minutos.   Limpiar vidrios o pisos durante 45 a 60 minutos.  Document Released: 04/05/2010 Document Revised: 09/11/2010 [employer] Patient Information 2012 [employer]. 

## 2012-11-10 NOTE — Telephone Encounter (Signed)
Orders placed, pt will be contacted by breast center.

## 2012-11-10 NOTE — Progress Notes (Signed)
Helen Mccarthy 1970-03-08 [id number]   History:    43 y.o.  for annual gyn exam  Who stated for the past several months she was noting seeing a discolored area on her side of the right breast and tender to palpation and felt some irregularity. Patient has not had her baseline mammogram. Patient has past history of tubal sterilization procedure. Patient has had 4 prior cesarean sections. Patient states her Pap smear have always been normal.  Past medical history,surgical history, family history and social history were all reviewed and documented in the EPIC chart.  Gynecologic History Patient's last menstrual period was 10/27/2012. Contraception: tubal ligation Last Pap: 2013. Results were: normal Last mammogram: no prior study. Results were: no prior study  Obstetric History OB History  Gravida Para Term Preterm AB SAB TAB Ectopic Multiple Living  4 4 4       4     # Outcome Date GA Lbr Len/2nd Weight Sex Delivery Anes PTL Lv  4 TRM     F CS  N Y  3 TRM     F CS  N Y  2 TRM     M CS  N Y  1 TRM     M CS  N Y     Comments: DECEASED THREE DAYS AFTER BEING BORN-        ROS: A ROS was performed and pertinent positives and negatives are included in the history.  GENERAL: No fevers or chills. HEENT: No change in vision, no earache, sore throat or sinus congestion. NECK: No pain or stiffness. CARDIOVASCULAR: No chest pain or pressure. No palpitations. PULMONARY: No shortness of breath, cough or wheeze. GASTROINTESTINAL: No abdominal pain, nausea, vomiting or diarrhea, melena or bright red blood per rectum. GENITOURINARY: No urinary frequency, urgency, hesitancy or dysuria. MUSCULOSKELETAL: No joint or muscle pain, no back pain, no recent trauma. DERMATOLOGIC: No rash, no itching, no lesions. ENDOCRINE: No polyuria, polydipsia, no heat or cold intolerance. No recent change in weight. HEMATOLOGICAL: No anemia or easy bruising or bleeding. NEUROLOGIC: No headache, seizures, numbness,  tingling or weakness. PSYCHIATRIC: No depression, no loss of interest in normal activity or change in sleep pattern.   right breast tenderness  Exam: chaperone present  BP 118/76  Ht 4\' 10"  (1.473 m)  Wt 157 lb 6.4 oz (71.396 kg)  BMI 32.91 kg/m2  LMP 10/27/2012  Body mass index is 32.91 kg/(m^2).  General appearance : Well developed well nourished female. No acute distress HEENT: Neck supple, trachea midline, no carotid bruits, no thyroidmegaly Lungs: Clear to auscultation, no rhonchi or wheezes, or rib retractions  Heart: Regular rate and rhythm, no murmurs or gallops Breastslightly pigmented area of the right breast at the 11:00 position 4 fingerbreadths from the areola region. Tenderness was noted a slightly irregular area 1/2 cm in size. Contralateral breast was normal. There was no supraclavicular or axillary lymphadenopathy on either breast. Abdomen: no palpable masses or tenderness, no rebound or guarding Extremities: no edema or skin discoloration or tenderness  Pelvic:  Bartholin, Urethra, Skene Glands: Within normal limits             Vagina: No gross lesions or discharge  Cervix: No gross lesions or discharge  Uterus  anteverted, normal size, shape and consistency, non-tender and mobile  Adnexa  Without masses or tenderness  Anus and perineum  normal   Rectovaginal  normal sphincter tone without palpated masses or tenderness  Hemoccult none indicated     Assessment/Plan:  42 y.o. female for annual exam with recent onset of tender right breast along with a slightly pigmented area and tender irregular area on the right breast at the 11:00 position 4 fingerbreadths from the areolar region. Patient will be sent for a diagnostic mammogram of that breast and a screening mammogram of the contralateral breast. Ultrasound if indicated. The following lab work today: Fasting lipid profile, comprehensive metabolic panel, TSH, CBC, and urinalysis. Pap smear was not done  today in accordance to the new guidelines. Patient's flu vaccine was up to date.  Note: This dictation was prepared with  Dragon/digital dictation along withSmart phrase technology. Any transcriptional errors that result from this process are unintentional.   Ok Edwards MD, 1:56 PM 11/10/2012

## 2012-11-10 NOTE — Telephone Encounter (Signed)
Message copied by Aura Camps on Wed Nov 10, 2012  3:32 PM ------      Message from: Ok Edwards      Created: Wed Nov 10, 2012  2:01 PM       Victorino Dike please schedule diagnostic mammogram on this patient history as follows:                  tender right breast along with a slightly pigmented area and tender irregular area on the right breast at the 11:00 position 4 fingerbreadths from the areolar region. Patient will be sent for a diagnostic mammogram of that breast and a screening mammogram of the contralateral breast. Ultrasound if indicated.  ------

## 2012-11-11 ENCOUNTER — Other Ambulatory Visit: Payer: Self-pay | Admitting: Gynecology

## 2012-11-11 DIAGNOSIS — E7800 Pure hypercholesterolemia, unspecified: Secondary | ICD-10-CM

## 2012-11-11 DIAGNOSIS — D649 Anemia, unspecified: Secondary | ICD-10-CM

## 2012-11-11 LAB — URINALYSIS W MICROSCOPIC + REFLEX CULTURE
Bacteria, UA: NONE SEEN
Bilirubin Urine: NEGATIVE
Casts: NONE SEEN
Glucose, UA: NEGATIVE mg/dL
Ketones, ur: NEGATIVE mg/dL
Protein, ur: NEGATIVE mg/dL
Squamous Epithelial / LPF: NONE SEEN
Urobilinogen, UA: 0.2 mg/dL (ref 0.0–1.0)

## 2012-11-29 NOTE — Telephone Encounter (Signed)
appt 12/08/12 1:15 pm

## 2012-12-08 ENCOUNTER — Ambulatory Visit
Admission: RE | Admit: 2012-12-08 | Discharge: 2012-12-08 | Disposition: A | Discharge status: Home / Self Care | Payer: BC Managed Care – PPO | Source: Ambulatory Visit | Attending: Gynecology | Admitting: Gynecology

## 2012-12-08 DIAGNOSIS — N644 Mastodynia: Secondary | ICD-10-CM

## 2013-04-20 ENCOUNTER — Other Ambulatory Visit: Payer: BC Managed Care – PPO

## 2013-04-20 DIAGNOSIS — E7800 Pure hypercholesterolemia, unspecified: Secondary | ICD-10-CM

## 2013-04-20 DIAGNOSIS — D649 Anemia, unspecified: Secondary | ICD-10-CM

## 2013-04-20 LAB — CBC WITH DIFFERENTIAL/PLATELET
Basophils Absolute: 0.1 10*3/uL (ref 0.0–0.1)
Basophils Relative: 1 % (ref 0–1)
EOS PCT: 6 % — AB (ref 0–5)
Eosinophils Absolute: 0.5 10*3/uL (ref 0.0–0.7)
HCT: 34 % — ABNORMAL LOW (ref 36.0–46.0)
Hemoglobin: 11.5 g/dL — ABNORMAL LOW (ref 12.0–15.0)
LYMPHS ABS: 2.7 10*3/uL (ref 0.7–4.0)
LYMPHS PCT: 30 % (ref 12–46)
MCH: 24.7 pg — ABNORMAL LOW (ref 26.0–34.0)
MCHC: 33.8 g/dL (ref 30.0–36.0)
MCV: 73.1 fL — AB (ref 78.0–100.0)
MONO ABS: 0.8 10*3/uL (ref 0.1–1.0)
MONOS PCT: 9 % (ref 3–12)
Neutro Abs: 4.9 10*3/uL (ref 1.7–7.7)
Neutrophils Relative %: 54 % (ref 43–77)
Platelets: 365 10*3/uL (ref 150–400)
RBC: 4.65 MIL/uL (ref 3.87–5.11)
RDW: 15.6 % — AB (ref 11.5–15.5)
WBC: 9 10*3/uL (ref 4.0–10.5)

## 2013-04-20 LAB — LIPID PANEL
CHOL/HDL RATIO: 4.4 ratio
Cholesterol: 234 mg/dL — ABNORMAL HIGH (ref 0–200)
HDL: 53 mg/dL (ref >39)
LDL CALC: 165 mg/dL — AB (ref 0–99)
TRIGLYCERIDES: 78 mg/dL (ref <150)
VLDL: 16 mg/dL (ref 0–40)

## 2013-05-11 ENCOUNTER — Encounter: Payer: Self-pay | Admitting: Gynecology

## 2013-05-11 ENCOUNTER — Ambulatory Visit (INDEPENDENT_AMBULATORY_CARE_PROVIDER_SITE_OTHER): Payer: BC Managed Care – PPO | Admitting: Gynecology

## 2013-05-11 VITALS — BP 126/82

## 2013-05-11 DIAGNOSIS — E785 Hyperlipidemia, unspecified: Secondary | ICD-10-CM

## 2013-05-11 DIAGNOSIS — D649 Anemia, unspecified: Secondary | ICD-10-CM

## 2013-05-11 DIAGNOSIS — D259 Leiomyoma of uterus, unspecified: Secondary | ICD-10-CM

## 2013-05-11 MED ORDER — ATORVASTATIN CALCIUM 10 MG PO TABS: 10.0000 mg | ORAL_TABLET | Freq: Every day | ORAL | Status: DC

## 2013-05-11 NOTE — Patient Instructions (Signed)
Colesterol (Cholesterol) El colesterol es una sustancia blanca, grasa que, como las protenas, en pequeas cantidades es necesaria para el cuerpo. El hgado fabrica todo el colesterol que se necesita. Se transporta desde el hgado [employer] sangre a travs de los vasos sanguneos. Pueden acumularse depsitos (placa) en las paredes de los vasos sanguneos. Esto hace que las arterias se hagan ms angostas y rgidas. La placa aumenta el riesgo de sufrir ataques cardacos e ictus. Usted no podr Economist de colesterol que tenga an cuando este fuera muy elevado. La nica forma de conocerlo es mediante un examen de sangre que evale los niveles de lpidos [place]). Una vez que conozca su nivel de colesterol, deber llevar un registro de los [place] del examen. Trabaje con el profesional que lo asiste para [employer] niveles dentro del rango deseado. QUE SIGNIFICAN LOS RESULTADOS:  El colesterol total es una medicin aproximada de la suma de todos los subtipos de colesterol en su sangre.  LDL es el denominado colesterol malo. Este es el tipo que deposita el colesterol en [place] arterias. Debe mantener bajo [facility].  El HDL es el colesterol bueno porque limpia las arterias y se lleva el LDL. Debe mantener [employer].  Los triglicridos son lpidos que el cuerpo puede o bien quemar para obtener energa, o Production assistant, radio. Los niveles elevados estn relacionados estrechamente con enfermedades coronarias. NIVELES DESEADOS:  Colesterol total debajo de 200.  LDL debajo de 100 para personas en riesgo, debajo de 42 para Surveyor, minerals.  HDL mayor a 50 es bueno, mayor a 60 es mejor.  Triglicridos por debajo de 150. CMO DISMINUIR SU NIVEL DE COLESTEROL:  Dieta  Elija pescado o carne blanca de pollo o pavo, asado u horneado. Limite los cortes grasos de carne roja, los alimentos fritos, y las carnes procesadas, como salchichas y fiambres.  Consuma gran cantidad de  frutas y Event organiser. Elija granos enteros, frijoles, pastas, papas y cereales.  Slo utilice pequeas cantidades de aceite de oliva, maz o canola. Evite la [place], la [place], las grasas para [place], y el aceite de Scientist, physiological. Evite los alimentos con grasas saturadas.  Utilice [employer] / sin grasas y yogur y quesos bajos en grasas o sin grasa. Evite la Mattel, la crema, los [place], la yema de [place] y [place]. Los postres sanos [employer] "Midwife", las galletas de jengibre, galletas de [place], caramelos solubles, [place], y yogur helado bajo en grasas o sin grasas. Evite los bizcochos, las tortas, los pasteles y las [place].  Practique ejercicios.  Un programa de ejercicios regulares ayuda a disminuir el colesterol LDL y aumenta el HDL.  Favorece el control de [place].  Haga cosas que aumenten su nivel de actividad como la [place], Writer, o Risk manager las escaleras.  Medicamentos  El profesional que lo asiste podr prescribirlos para ayudarlo a reducir el colesterol y el riesgo de sufrir enfermedades cardacas.  Es posible que tambin deba tomar medicamentos an si sus niveles son normales, en el caso de que tenga varios factores de [place]. [place] y realice la actividad fsica segn las sugerencias del profesional que lo asiste.  Tome los medicamentos como se le indic.  Somtase a pruebas de [employer] profesional lo considere necesario. EST SEGURO QUE:   Comprende las instrucciones para el alta mdica.  Controlar su enfermedad.  Solicitar atencin mdica de inmediato segn las indicaciones. Document Released: 10/09/2004  Document Revised: 03/24/2011 [facility] Patient Information 2014 [place], [place]. Atorvastatin tablets Qu es este medicamento? La ATORVASTATINA es conocido como un inhibidor de la HMG-CoA reductasa o 'estatina'. Reduce el nivel de colesterol y triglicridos  en la sangre. Este medicamento tambin puede reducir el riesgo de Insurance risk surveyor ataques cardiacos, derrame cerebral u otros problemas de la salud en pacientes que corren el riesgo de padecer una enfermedad cardiaca. Lucius Conn y cambios a su estilo de vida son a menudo utilizados con Fish farm manager. Este medicamento puede ser utilizado para otros usos; si tiene alguna pregunta consulte con su proveedor de atencin mdica o con su farmacutico. MARCAS COMERCIALES DISPONIBLES: Lipitor Qu le debo informar a mi profesional de la salud antes de tomar este medicamento? Necesita saber si usted presenta alguno de los siguientes problemas o situaciones: -consume bebidas alcohlicas con frecuencia -antecedentes de derrame cerebral, TIA -enfermedad renal -enfermedad heptica -dolores o debilidades musculares -otra condicin mdica -una reaccin alrgica o inusual a la atorvastatina, a otros medicamentos, alimentos, colorantes o conservadores -si est embarazada o buscando quedar embarazada -si est amamantando a un beb Cmo debo utilizar este medicamento? Tome este medicamento por va oral con un vaso de agua. Siga las instrucciones de la etiqueta del [place]. Puede tomar este medicamento con o sin alimentos. Tome sus dosis a intervalos regulares. No tome su medicamento con una frecuencia mayor a la indicada. Hable con su pediatra para informarse acerca del uso de este medicamento en nios. Aunque este medicamento ha sido recetado a nios tan menores como de 10 aos de edad para condiciones selectivas, las precauciones se aplican. Sobredosis: Pngase en contacto inmediatamente con un centro toxicolgico o una sala de urgencia si usted cree que haya tomado demasiado medicamento. ATENCIN: [employer] es solo para usted. No comparta este medicamento con nadie. Qu sucede si me olvido de una dosis? Si olvida una dosis, tmela lo antes posible. Si es casi la hora de su dosis siguiente, tome slo esa  dosis. No tome dosis adicionales o dobles. Qu puede interactuar con este medicamento? No tome esta medicina con ninguno de los siguientes medicamentos: -levadura roja de arroz -telaprevir -telitromicina -voriconazol Esta medicina tambin puede interactuar con los siguientes medicamentos: -alcohol -medicamentos antivirales para el VIH o SIDA -boceprevir -ciertos antibiticos, tales como Geophysicist/field seismologist, eritromicina, troleandomicina -ciertos medicamentos para el colesterol, tales como fenofibrato o gemfibrozil -cimetidina -claritromicina -colchicina -ciclosporina -digoxina -hormonas femeninas, como estrgenos, progestinas o pldoras anticonceptivas -jugo de toronja -medicamentos para las infecciones micticas, tales como fluconazol, itraconazol, quetoconazol -niacina -rifampicina -espironolactona Puede ser que esta lista no menciona todas las posibles interacciones. Informe a su profesional de [employer]	[place] de [employer] productos a base de hierbas, medicamentos de [place] o suplementos nutritivos que est tomando. Si usted fuma, consume bebidas alcohlicas o si utiliza drogas ilegales, indqueselo tambin a su profesional de [employer]	[place]. Algunas sustancias pueden interactuar con su medicamento. A qu debo estar atento al usar [employer]a? Visite a su mdico o a su profesional de la salud para chequeos peridicos. Puede necesitar exmenes regulares para asegurarse de que el hgado est funcionando bien. Informe a su mdico o a su profesional de la salud tan pronto como pueda si tiene debilidad, sensibilidad o dolores musculares que no tienen explicacin, especialmente si tambin tiene fiebre y [place]. Su mdico o profesional de Technical sales engineer puede informarle que deje de tomar este medicamento si desarrolla problemas musculares. Si sus problemas musculares no desaparecen despus de parar este medicamento, comunquese con su profesional de la  salud. Este medicamento es slo parte de un  programa integral para la salud de Training and development officer. Su mdico o dietista pueden sugerirle una dieta con bajo contenido de colesterol y de grasa para ayudarle. Evite el alcohol y Copy, y [place] un programa adecuado de ejercicios fsicos. No utilice este medicamento si est embarazada o amamantando. Existe la posibilidad de efectos secundarios graves a un beb sin nacer o a Oncologist. Para ms informacin hable con su farmacutico o su mdico. Este medicamento puede afectar su nivel de azcar en la sangre. Si tiene diabetes, consulte a su mdico o a su profesional de la salud antes de cambiar su dieta o la dosis de su medicamento para la diabetes. Si va a someterse a una operacin, informe a su profesional de la salud que est tomando [employer]. Qu efectos secundarios puedo tener al [employer] este medicamento? Efectos secundarios que debe informar a su mdico o a Barrister's clerk de la salud tan pronto como sea posible: -Chief of Staff como erupcin cutnea, picazn o urticarias, hinchazn de la cara, labios o lengua -orina de color amarillo oscuro -fiebre -dolores articulares -calambres, dolores musculares -enrojecimiento, formacin de ampollas, descamacin o distensin de la piel, inclusive dentro de la boca -dificultad para orinar o cambios en el volumen de orina -cansancio o debilidad inusual -color amarillento de ojos o piel Efectos secundarios que, por lo general, no requieren atencin mdica (debe informarlos a su mdico o a su profesional de la salud si persisten o si son molestos): -estreimiento -acidez de estmago -gas, dolor, Higher education careers adviser Puede ser que esta lista no menciona todos los posibles efectos secundarios. Comunquese a su mdico por asesoramiento mdico [employer]. Usted puede informar los efectos secundarios a la FDA por telfono al 1-800-FDA-1088. Dnde debo guardar mi medicina? Mantngala fuera del alcance de los nios. Gurdela a una  Levi Strauss 20 y 91 grados C (74 y 80 grados F). Deseche todo el medicamento que no haya utilizado, despus de la fecha de vencimiento. ATENCIN: Este folleto es un resumen. Puede ser que no cubra toda la posible informacin. Si usted tiene preguntas acerca de esta medicina, consulte con su mdico, su farmacutico o su profesional de Technical sales engineer.  2014, Elsevier/Gold Standard. (2010-11-22 17:43:43)

## 2013-05-11 NOTE — Progress Notes (Signed)
   Patient presented to the office today to discuss her recent lab work. Review of her record indicates she was last seen in the office in October 2014. Patient with history of fibroid uterus and anemia. Since last office visit she began taking one iron tablet daily. Patient with prior tubal sterilization procedure having cycles that last 5 days which sometimes she states are very heavy.   Past lipid profile as follows:  Results for MONTI, VILLERS (MRN [id number]) as of 05/11/2013 15:21  Ref. Range 11/10/2012 12:47 12/08/2012 14:02 04/20/2013 12:18  Cholesterol Latest Range: 0-200 mg/dL 244 (H)  234 (H)  Triglycerides Latest Range: <150 mg/dL 105  78  HDL Latest Range: >39 mg/dL 58  53  LDL (calc) Latest Range: 0-99 mg/dL 165 (H)  165 (H)  VLDL Latest Range: 0-40 mg/dL 21  16  Total CHOL/HDL Ratio No range found 4.2  4.4    Her hemoglobin and hematocrit April of this year was 11.5 and 34.0 respectively with normal platelet count of 365,000.  She attempted diet and exercise but has not been successful. She has a BMI of 32. We discussed starting her on a low dose statin such as Lipitor 10 mg daily since she has no PCP. The risk benefits and pros and cons of medication were discussed. She had normal liver function tests in October 2014. She will return back to the office in 3 months for followup SGOT and SGPT and fasting lipid profile along with a CBC. All the above was discussed in Spanish as well as literature and information provided on the medication as well. When she returns to the office we will do an ultrasound to compare with her previous ultrasound scan of 2-1/2 years ago in reference to her fibroid uterus.

## 2013-08-24 ENCOUNTER — Encounter: Payer: Self-pay | Admitting: Gynecology

## 2013-08-24 ENCOUNTER — Other Ambulatory Visit: Payer: Self-pay | Admitting: Gynecology

## 2013-08-24 ENCOUNTER — Ambulatory Visit (INDEPENDENT_AMBULATORY_CARE_PROVIDER_SITE_OTHER): Payer: BC Managed Care – PPO

## 2013-08-24 ENCOUNTER — Ambulatory Visit (INDEPENDENT_AMBULATORY_CARE_PROVIDER_SITE_OTHER): Payer: BC Managed Care – PPO | Admitting: Gynecology

## 2013-08-24 DIAGNOSIS — D250 Submucous leiomyoma of uterus: Secondary | ICD-10-CM

## 2013-08-24 DIAGNOSIS — N831 Corpus luteum cyst of ovary, unspecified side: Secondary | ICD-10-CM

## 2013-08-24 DIAGNOSIS — D251 Intramural leiomyoma of uterus: Secondary | ICD-10-CM

## 2013-08-24 DIAGNOSIS — N852 Hypertrophy of uterus: Secondary | ICD-10-CM

## 2013-08-24 DIAGNOSIS — D259 Leiomyoma of uterus, unspecified: Secondary | ICD-10-CM

## 2013-08-24 DIAGNOSIS — D649 Anemia, unspecified: Secondary | ICD-10-CM

## 2013-08-24 DIAGNOSIS — D509 Iron deficiency anemia, unspecified: Secondary | ICD-10-CM

## 2013-08-24 DIAGNOSIS — E785 Hyperlipidemia, unspecified: Secondary | ICD-10-CM

## 2013-08-24 LAB — CBC
HCT: 35.9 % — ABNORMAL LOW (ref 36.0–46.0)
Hemoglobin: 12.2 g/dL (ref 12.0–15.0)
MCH: 24.9 pg — ABNORMAL LOW (ref 26.0–34.0)
MCHC: 34 g/dL (ref 30.0–36.0)
MCV: 73.4 fL — ABNORMAL LOW (ref 78.0–100.0)
PLATELETS: 354 10*3/uL (ref 150–400)
RBC: 4.89 MIL/uL (ref 3.87–5.11)
RDW: 15.1 % (ref 11.5–15.5)
WBC: 10.8 10*3/uL — ABNORMAL HIGH (ref 4.0–10.5)

## 2013-08-24 LAB — LIPID PANEL
Cholesterol: 233 mg/dL — ABNORMAL HIGH (ref 0–200)
HDL: 60 mg/dL (ref >39)
LDL Cholesterol: 154 mg/dL — ABNORMAL HIGH (ref 0–99)
Total CHOL/HDL Ratio: 3.9 Ratio
Triglycerides: 96 mg/dL (ref <150)
VLDL: 19 mg/dL (ref 0–40)

## 2013-08-24 MED ORDER — ATORVASTATIN CALCIUM 10 MG PO TABS: 10.0000 mg | ORAL_TABLET | Freq: Every day | ORAL | Status: DC

## 2013-08-24 NOTE — Patient Instructions (Signed)
Colesterol (Cholesterol) El colesterol es una sustancia blanca y cerosa parecida a la grasa que el organismo necesita en pequeas cantidades. El hgado fabrica todo el colesterol que necesita. La sangre lo transporta desde el hgado a travs de los vasos sanguneos. Los depsitos de colesterol (placa) pueden acumularse en las paredes de los vasos sanguneos, lo que ocasiona el estrechamiento y la rigidez de las arterias. Las placas de colesterol aumentan el riesgo de ataque cardaco e ictus.  Aunque sea muy elevado, la concentracin de colesterol no puede percibirse. La nica forma de saber que tiene colesterol alto es mediante un anlisis de [place]. Una vez que se conocen las concentraciones de Research officer, trade union, se Mining engineer un registro de los [place] de [place]. Trabaje con el mdico para [employer] en el rango deseado.  [place]?  El colesterol total es una medida general de todo el colesterol en [place].  El LDL es el que se conoce como colesterol malo y es el que se deposita en las paredes de las arterias. Su concentracin debe ser baja.  El HDL es el colesterol bueno porque limpia las arterias y arrastra el LDL. Su concentracin debe ser alta.  Los triglicridos son grasas que el cuerpo puede quemar ya sea para energa o para almacenamiento. Las concentraciones altas estn estrechamente vinculadas con las enfermedades cardacas. CULES SON LAS CONCENTRACIONES DE COLESTEROL DESEADAS?  El colesterol total por debajo de 200.  El LDL por debajo de 100 en las personas en riesgo y por debajo de 31 en aquellas que corren un riesgo muy alto.  El HDL por [place] de 50es bueno y por [place] de 60 es lo mejor.  Los triglicridos por debajo de 150. [place]?  Dieta Siga los programas de alimentacin que el mdico le indique.  Elija el pescado o la carne blanca de pollo y [place], asados u horneados. Limite los cortes grasos de carne  roja, los alimentos fritos y las carnes procesadas, como las salchichas y los embutidos.  Coma gran cantidad de frutas y verduras frescas.  Elija los cereales integrales, los frijoles, las pastas, las papas y los cereales.  Use solamente pequeas cantidades de aceite de oliva, maz o canola.  No coma mantequilla, [place], margarina o aceites de [place].  Evite los alimentos que contengan grasas trans.  Tome leche semidescremada o sin grasa y coma yogur y quesos bajos en grasas o sin grasa. Evite la Mattel, la crema, los [place], las yemas de [place] y los quesos enteros.  Los postres sanos incluyen la torta ngel, los bocadillos de [place], las Administrator con forma de [place], los caramelos duros, los helados de agua y el yogur bajo en grasa o sin grasa. Evite las [place], tortas, pasteles y [place].  Haga actividad fsica. Siga los programas de ejercicios segn las indicaciones del mdico.  Un programa regular ayuda a bajar el colesterol LDL y aumentar el HDL,  y a Technical sales engineer.  Haga cosas que aumenten su nivel de [place], por [place], haga [place], salga a caminar o suba y [place] escaleras. Pregntele al mdico cmo puede aumentar la actividad en su vida cotidiana.  Medicamentos. Tome todos los [employer] como le indic el mdico.  El mdico puede recetarle medicamentos para ayudar a Management consultant colesterol y reducir el riesgo de enfermedades cardacas.  Si tiene varios factores de riesgo, tal vez tenga que tomar medicamentos, incluso si las concentraciones son normales. Document Released: 10/09/2004 Document Revised: 05/16/2013 ExitCare  Patient Information 2015 [place]. This information is not intended to replace advice given to you by your health care provider. Make sure you discuss any questions you have with your health care provider. Opciones de alimentos para Therapist, nutritional de triglicridos (Food Choices to Lower Your Triglycerides) Los triglicridos  son un tipo de grasas que se Chief Strategy Officer. Un nivel elevado de triglicridos puede aumentar el riesgo de padecer enfermedades cardacas e infartos. Si sus niveles de triglicridos son altos, los alimentos que se ingieren y los hbitos de alimentacin son Theatre stage manager. Elegir los alimentos adecuados puede ayudar a Therapist, nutritional de triglicridos.  [place]?  Baje de peso si es necesario.  Limite o evite el alcohol.  Llene la mitad del plato con vegetales y ensaladas de hojas verdes.  Gambier a dos porciones por da. Elija frutas en lugar de jugos.  Ocupe un cuarto del plato con cereales integrales. Busque la palabra "integral" en Equities trader de la lista de ingredientes.  Llene un cuarto del plato con alimentos con protenas magras.  Disfrute de pescados grasos (como salmn, caballa, sardinas y atn) tres veces por semana.  [place] grasas saludables.  Limite los alimentos con alto contenido de almidn y Location manager.  Consuma ms comida casera y menos de restaurante, de buf y comida rpida.  Limite el consumo de alimentos fritos.  Cocine los alimentos utilizando mtodos que no sean la fritura.  Limite el consumo de grasas saturadas.  Verifique las listas de ingredientes para evitar alimentos con aceites parcialmente hidrogenados (grasas trans). QU ALIMENTOS PUEDO COMER?  Cereales Cereales integrales, como los panes de salvado o [place], las [place], los cereales y las pastas. Avena sin endulzar, trigo, [place], quinua o arroz integral. Tortillas de harina de maz o de salvado.  Vegetales Verduras frescas o congeladas (crudas, al vapor, asadas o grilladas). Ensaladas de hojas verdes. Fruits Frutas frescas, en conserva (en su jugo natural) o frutas congeladas. Carnes y otros productos con protenas Carne de res molida (al 85% o ms [place]), carne de res de animales alimentados con pastos o carne de res sin la grasa. Pollo o pavo  sin piel. Carne de pollo o de [place]. Cerdo sin la grasa. Todos los pescados y frutos de mar. Huevos. Porotos, guisantes o lentejas secos. Frutos secos o semillas sin sal. Frijoles secos o en lata sin sal. Lcteos Productos lcteos con bajo contenido de grasas, como [place] o al 1%, quesos reducidos en grasas o al 2%, ricota con bajo contenido de grasas o [employer], o yogur natural con bajo contenido de [place]. Grasas y Naval architect en barra que no contengan grasas trans. [place] y condimentos para ensaladas livianos o reducidos en grasas. [place]. Aceites de crtamo, oliva o canola. Mantequilla natural de man o almendra. Los artculos mencionados arriba pueden no ser [employer] de las bebidas o los alimentos recomendados. Comunquese con el nutricionista para conocer ms opciones. QU ALIMENTOS NO SE RECOMIENDAN?  Cereales Pan blanco. Pastas blancas. Arroz blanco. Pan de maz. Bagels, pasteles y croissants. Galletas saladas que contengan grasas trans. Vegetales Papas blancas. Maz. Vegetales con crema o fritos. Verduras en [employer]. Fruits Frutas secas. Fruta enlatada en almbar liviano o espeso. Jugo de frutas. Carnes y otros productos con protenas [place] de carne con Lobbyist. Costillas, alas de pollo, tocineta, salchicha, mortadela, salame, chinchulines, tocino, perros calientes, salchichas alemanas y embutidos envasados. Lcteos Leche entera o al 2%, crema,  mezcla de [place] y crema y queso crema. Yogur entero o endulzado. Quesos con toda su grasa. Cremas no lcteas y coberturas batidas. Quesos procesados, quesos para untar o cuajadas. Dulces y postres Jarabe de maz, azcares, miel y Control and instrumentation engineer. Caramelos. Mermelada y [place]. Chrissie Noa. Cereales endulzados. Galletas, pasteles, bizcochuelos, donas, muffins y helado. Grasas y aceites [place], [place] en barra, [place] de [place], [place], [place] clarificada o grasa de tocino. Aceites de coco, de  palmiste o de palma. Bebidas Alcohol. Bebidas endulzadas (como refrescos, limonadas y bebidas frutales o ponches). Los artculos mencionados arriba pueden no ser [employer] de las bebidas y los alimentos que se Higher education careers adviser. Comunquese con el nutricionista para recibir ms informacin. Document Released: 06/19/2009 Document Revised: 01/04/2013 [employer] Patient Information 2015 [place]. This information is not intended to replace advice given to you by your health care provider. Make sure you discuss any questions you have with your health care provider. Atorvastatin tablets Qu es este medicamento? La ATORVASTATINA es conocido como un inhibidor de la HMG-CoA reductasa o 'estatina'. Reduce el nivel de colesterol y triglicridos en la sangre. Este medicamento tambin puede reducir el riesgo de Insurance risk surveyor ataques cardiacos, derrame cerebral u otros problemas de la salud en pacientes que corren el riesgo de padecer una enfermedad cardiaca. Lucius Conn y cambios a su estilo de vida son a menudo utilizados con Fish farm manager. Este medicamento puede ser utilizado para otros usos; si tiene alguna pregunta consulte con su proveedor de atencin mdica o con su farmacutico. MARCAS COMERCIALES DISPONIBLES: Lipitor Qu le debo informar a mi profesional de la salud antes de tomar este medicamento? Necesita saber si usted presenta alguno de los siguientes problemas o situaciones: -consume bebidas alcohlicas con frecuencia -antecedentes de derrame cerebral, TIA -enfermedad renal -enfermedad heptica -dolores o debilidades musculares -otra condicin mdica -una reaccin alrgica o inusual a la atorvastatina, a otros medicamentos, alimentos, colorantes o conservadores -si est embarazada o buscando quedar embarazada -si est amamantando a un beb Cmo debo utilizar este medicamento? Tome este medicamento por va oral con un vaso de agua. Siga las instrucciones de la etiqueta del [place].  Puede tomar este medicamento con o sin alimentos. Tome sus dosis a intervalos regulares. No tome su medicamento con una frecuencia mayor a la indicada. Hable con su pediatra para informarse acerca del uso de este medicamento en nios. Aunque este medicamento ha sido recetado a nios tan menores como de 10 aos de edad para condiciones selectivas, las precauciones se aplican. Sobredosis: Pngase en contacto inmediatamente con un centro toxicolgico o una sala de urgencia si usted cree que haya tomado demasiado medicamento. ATENCIN: [employer] es solo para usted. No comparta este medicamento con nadie. Qu sucede si me olvido de una dosis? Si olvida una dosis, tmela lo antes posible. Si es casi la hora de su dosis siguiente, tome slo esa dosis. No tome dosis adicionales o dobles. Qu puede interactuar con este medicamento? No tome esta medicina con ninguno de los siguientes medicamentos: -levadura roja de arroz -telaprevir -telitromicina -voriconazol Esta medicina tambin puede interactuar con los siguientes medicamentos: -alcohol -medicamentos antivirales para el VIH o SIDA -boceprevir -ciertos antibiticos, tales como Geophysicist/field seismologist, eritromicina, troleandomicina -ciertos medicamentos para el colesterol, tales como fenofibrato o gemfibrozil -cimetidina -claritromicina -colchicina -ciclosporina -digoxina -hormonas femeninas, como estrgenos, progestinas o pldoras anticonceptivas -jugo de toronja -medicamentos para las infecciones micticas, tales como fluconazol, itraconazol, quetoconazol -niacina -rifampicina -espironolactona Puede ser que esta lista no menciona todas las posibles interacciones. Informe a su profesional de  la salud de [employer] Corporation productos a base de hierbas, medicamentos de venta libre o suplementos nutritivos que est tomando. Si usted fuma, consume bebidas alcohlicas o si utiliza drogas ilegales, indqueselo tambin a su profesional de [employer]	[place]. Algunas  sustancias pueden interactuar con su medicamento. A qu debo estar atento al usar Coca[employer]Cola? Visite a su mdico o a su profesional de la salud para chequeos peridicos. Puede necesitar exmenes regulares para asegurarse de que el hgado est funcionando bien. Informe a su mdico o a su profesional de la salud tan pronto como pueda si tiene debilidad, sensibilidad o dolores musculares que no tienen explicacin, especialmente si tambin tiene fiebre y [place]. Su mdico o profesional de Technical sales engineer puede informarle que deje de tomar este medicamento si desarrolla problemas musculares. Si sus problemas musculares no desaparecen despus de parar este medicamento, comunquese con su profesional de [employer]	[place]. Este medicamento es slo parte de un programa integral para la salud de Training and development officer. Su mdico o dietista pueden sugerirle una dieta con bajo contenido de colesterol y de grasa para ayudarle. Evite el alcohol y Copy, y [place] un programa adecuado de ejercicios fsicos. No utilice este medicamento si est embarazada o amamantando. Existe la posibilidad de efectos secundarios graves a un beb sin nacer o a Oncologist. Para ms informacin hable con su farmacutico o su mdico. Este medicamento puede afectar su nivel de azcar en la sangre. Si tiene diabetes, consulte a su mdico o a su profesional de la salud antes de cambiar su dieta o la dosis de su medicamento para la diabetes. Si va a someterse a una operacin, informe a su profesional de la salud que est tomando Coca[employer]Cola. Qu efectos secundarios puedo tener al [employer] este medicamento? Efectos secundarios que debe informar a su mdico o a Barrister's clerk de la salud tan pronto como sea posible: -Chief of Staff como erupcin cutnea, picazn o urticarias, hinchazn de la cara, labios o lengua -orina de color amarillo oscuro -fiebre -dolores articulares -calambres, dolores musculares -enrojecimiento, formacin de ampollas,  descamacin o distensin de la piel, inclusive dentro de la boca -dificultad para orinar o cambios en el volumen de orina -cansancio o debilidad inusual -color amarillento de ojos o piel Efectos secundarios que, por lo general, no requieren atencin mdica (debe informarlos a su mdico o a su profesional de la salud si persisten o si son molestos): -estreimiento -acidez de estmago -gas, dolor, Higher education careers adviser Puede ser que esta lista no menciona todos los posibles efectos secundarios. Comunquese a su mdico por asesoramiento mdico Hum[employer] Inc. Usted puede informar los efectos secundarios a la FDA por telfono al 1-800-FDA-1088. Dnde debo guardar mi medicina? Mantngala fuera del alcance de los nios. Gurdela a una Levi Strauss 20 y 72 grados C (5 y 31 grados F). Deseche todo el medicamento que no haya utilizado, despus de la fecha de vencimiento. ATENCIN: Este folleto es un resumen. Puede ser que no cubra toda la posible informacin. Si usted tiene preguntas acerca de esta medicina, consulte con su mdico, su farmacutico o su profesional de Technical sales engineer.  2015, Elsevier/Gold Standard. (2010-11-22 17:43:43)

## 2013-08-24 NOTE — Progress Notes (Signed)
   The patient is a 43 year old who was seen in the office in April of this year and was started on Lipitor because of her persistently elevated LDL despite her trying to exercise and heavy a low-fat diet. She was supposed to return today for a fasting lipid profile and liver function test to see response to treatment. Although I spoke to her in Spanish she only took the medication for 1 month and is here for a followup blood work as well as for ultrasound to followup on her fibroid uterus. She has had history of heavy periods and anemia for which she is currently taking iron supplementation.  Ultrasound today uterus measured 10.5 x 8.3 x 6.2 cm with endometrial stripe of 13.9 mm patient is a few days and starting her menses. Patient had 4 fibroids and since one was suggestive of being submucosally a sonohysterogram was done by consider surgical Betadine solution and inserting a stroke catheter into the cavity. Normal saline was instilled. There was no submucous myoma slight indentation from one fibroid slightly encroaching endometrial cavity. She had right corpus luteum cysts irregular cystic liver measuring 24 x 21 x 24 mm.  Assessment/plan: #1 patient with hyperlipidemia did not understand the instructions given before in Spanish to take Lipitor daily. It was emphasized today to begin taking one tablet daily without any interruption until further instructions. She will return in December for her annual exam and will come in a fasting state so that we can check her fasting lipid profile and liver function tests. Since she is fasting today will go ahead and check her fasting lipid profile today in followup on her anemia with a CBC. All these instructions were given in written and oral format in her native language is Spanish.

## 2013-11-14 ENCOUNTER — Encounter: Payer: Self-pay | Admitting: Gynecology

## 2014-01-11 ENCOUNTER — Encounter: Payer: Self-pay | Admitting: Gynecology

## 2014-01-11 ENCOUNTER — Ambulatory Visit (INDEPENDENT_AMBULATORY_CARE_PROVIDER_SITE_OTHER): Payer: BC Managed Care – PPO | Admitting: Gynecology

## 2014-01-11 VITALS — BP 124/80 | Ht 58.5 in | Wt 154.0 lb

## 2014-01-11 DIAGNOSIS — Z862 Personal history of diseases of the blood and blood-forming organs and certain disorders involving the immune mechanism: Secondary | ICD-10-CM

## 2014-01-11 DIAGNOSIS — R42 Dizziness and giddiness: Secondary | ICD-10-CM

## 2014-01-11 DIAGNOSIS — D251 Intramural leiomyoma of uterus: Secondary | ICD-10-CM

## 2014-01-11 DIAGNOSIS — B373 Candidiasis of vulva and vagina: Secondary | ICD-10-CM

## 2014-01-11 DIAGNOSIS — B3731 Acute candidiasis of vulva and vagina: Secondary | ICD-10-CM

## 2014-01-11 DIAGNOSIS — Z01419 Encounter for gynecological examination (general) (routine) without abnormal findings: Secondary | ICD-10-CM

## 2014-01-11 DIAGNOSIS — B351 Tinea unguium: Secondary | ICD-10-CM

## 2014-01-11 DIAGNOSIS — N898 Other specified noninflammatory disorders of vagina: Secondary | ICD-10-CM

## 2014-01-11 DIAGNOSIS — E785 Hyperlipidemia, unspecified: Secondary | ICD-10-CM

## 2014-01-11 LAB — COMPREHENSIVE METABOLIC PANEL
ALT: 13 U/L (ref 0–35)
AST: 20 U/L (ref 0–37)
Albumin: 4 g/dL (ref 3.5–5.2)
Alkaline Phosphatase: 69 U/L (ref 39–117)
BILIRUBIN TOTAL: 0.5 mg/dL (ref 0.2–1.2)
BUN: 8 mg/dL (ref 6–23)
CALCIUM: 9.3 mg/dL (ref 8.4–10.5)
CHLORIDE: 102 meq/L (ref 96–112)
CO2: 24 mEq/L (ref 19–32)
CREATININE: 0.6 mg/dL (ref 0.50–1.10)
Glucose, Bld: 83 mg/dL (ref 70–99)
POTASSIUM: 3.8 meq/L (ref 3.5–5.3)
Sodium: 138 mEq/L (ref 135–145)
TOTAL PROTEIN: 7.4 g/dL (ref 6.0–8.3)

## 2014-01-11 LAB — CBC WITH DIFFERENTIAL/PLATELET
BASOS ABS: 0.1 10*3/uL (ref 0.0–0.1)
Basophils Relative: 1 % (ref 0–1)
EOS PCT: 8 % — AB (ref 0–5)
Eosinophils Absolute: 0.8 10*3/uL — ABNORMAL HIGH (ref 0.0–0.7)
HCT: 34.3 % — ABNORMAL LOW (ref 36.0–46.0)
Hemoglobin: 11.9 g/dL — ABNORMAL LOW (ref 12.0–15.0)
LYMPHS PCT: 25 % (ref 12–46)
Lymphs Abs: 2.5 10*3/uL (ref 0.7–4.0)
MCH: 24.9 pg — ABNORMAL LOW (ref 26.0–34.0)
MCHC: 34.7 g/dL (ref 30.0–36.0)
MCV: 71.9 fL — AB (ref 78.0–100.0)
MPV: 9.9 fL (ref 8.6–12.4)
Monocytes Absolute: 0.9 10*3/uL (ref 0.1–1.0)
Monocytes Relative: 9 % (ref 3–12)
NEUTROS ABS: 5.7 10*3/uL (ref 1.7–7.7)
NEUTROS PCT: 57 % (ref 43–77)
PLATELETS: 374 10*3/uL (ref 150–400)
RBC: 4.77 MIL/uL (ref 3.87–5.11)
RDW: 14.7 % (ref 11.5–15.5)
WBC: 10 10*3/uL (ref 4.0–10.5)

## 2014-01-11 LAB — WET PREP FOR TRICH, YEAST, CLUE: Trich, Wet Prep: NONE SEEN

## 2014-01-11 LAB — LIPID PANEL
CHOL/HDL RATIO: 3.9 ratio
Cholesterol: 220 mg/dL — ABNORMAL HIGH (ref 0–200)
HDL: 56 mg/dL (ref >39)
LDL CALC: 149 mg/dL — AB (ref 0–99)
Triglycerides: 76 mg/dL (ref <150)
VLDL: 15 mg/dL (ref 0–40)

## 2014-01-11 MED ORDER — BUTENAFINE HCL 1 % EX CREA: TOPICAL_CREAM | CUTANEOUS | Status: DC

## 2014-01-11 MED ORDER — FLUCONAZOLE 150 MG PO TABS
150.0000 mg | ORAL_TABLET | Freq: Once | ORAL | Status: DC
Start: 2014-01-11 — End: 2014-01-11

## 2014-01-11 MED ORDER — ATORVASTATIN CALCIUM 10 MG PO TABS: 10.0000 mg | ORAL_TABLET | Freq: Every day | ORAL | Status: DC

## 2014-01-11 NOTE — Progress Notes (Signed)
Helen Mccarthy 1970-01-25 [id number]   History:    43 y.o.  for annual gyn exam but with several complaints. She stated for the past few weeks she has been complaining of dizziness at times when she changes position. 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 She also was complaining of a left index finger fungal infection. Patient is also complaining of a vaginal discharge and some itching. Patient with history of hyperlipidemia. In April of this year she was started on Lipitor but after she took it for 1 month she did not continue she did not go pick up her medication refills. She has now been taking it diligently. Patient with known history of fibroid uterus see previous ultrasound report and sonohysterogram a few months ago here in the office. Patient reports normal menstrual cycles but the first day is very heavy. She has had history of anemia in the past. Patient denies any past history of any abnormal Pap smears. Her last mammogram was in 2014.  Past medical history,surgical history, family history and social history were all reviewed and documented in the EPIC chart.  Gynecologic History Patient's last menstrual period was 12/13/2013. Contraception: tubal ligation Last Pap: 2013. Results were: normal Last mammogram: 2014. Results were: normal  Obstetric History OB History  Gravida Para Term Preterm AB SAB TAB Ectopic Multiple Living  4 4 4       4     # Outcome Date GA Lbr Len/2nd Weight Sex Delivery Anes PTL Lv  4 Term     F CS-Unspec  N Y  3 Term     F CS-Unspec  N Y  2 Term     M CS-Unspec  N Y  1 Term     M CS-Unspec  N Y     Comments: DECEASED THREE DAYS AFTER BEING BORN-        ROS: A ROS was performed and pertinent positives and negatives are included in the history.  GENERAL: No fevers or chills. HEENT: No change in vision, no earache, sore throat or sinus congestion. NECK: No pain or stiffness. CARDIOVASCULAR: No chest pain or  pressure. No palpitations. PULMONARY: No shortness of breath, cough or wheeze. GASTROINTESTINAL: No abdominal pain, nausea, vomiting or diarrhea, melena or bright red blood per rectum. GENITOURINARY: No urinary frequency, urgency, hesitancy or dysuria. MUSCULOSKELETAL: No joint or muscle pain, no back pain, no recent trauma. DERMATOLOGIC: No rash, no itching, no lesions. ENDOCRINE: No polyuria, polydipsia, no heat or cold intolerance. No recent change in weight. HEMATOLOGICAL: No anemia or easy bruising or bleeding. NEUROLOGIC: No headache, seizures, numbness, tingling or weakness. PSYCHIATRIC: No depression, no loss of interest in normal activity or change in sleep pattern.     Exam: chaperone present  BP 124/80 mmHg  Ht 4' 10.5" (1.486 m)  Wt 154 lb (69.854 kg)  BMI 31.63 kg/m2  LMP 12/13/2013  Body mass index is 31.63 kg/(m^2).  General appearance : Well developed well nourished female. No acute distress HEENT: Neck supple, trachea midline, no carotid bruits, no thyroidmegaly Lungs: Clear to auscultation, no rhonchi or wheezes, or rib retractions  Heart: Regular rate and rhythm, no murmurs or gallops Breast:Examined in sitting and supine position were symmetrical in appearance, no palpable masses or tenderness,  no skin retraction, no nipple inversion, no nipple discharge, no skin discoloration, no axillary or supraclavicular lymphadenopathy Abdomen: no palpable masses or tenderness, no rebound or guarding Extremities: no  edema or skin discoloration or tenderness  Pelvic:  Bartholin, Urethra, Skene Glands: Within normal limits             Vagina: No gross lesions or discharge  Cervix: No gross lesions or discharge  Uterus  8-10 week size,  non-tender and mobile  Adnexa  Without masses or tenderness  Anus and perineum  normal   Rectovaginal  normal sphincter tone without palpated masses or tenderness             Hemoccult not indicated   Wet prep moderate yeast  Assessment/Plan:   43 y.o. female for annual exam with evidence of yeast vaginitis will be prescribed Monistat 3 intravaginal. Patient with history of hypothyroidism on Synthroid 50 g daily we will check her TSH today. As part of her annual exam will be checking her compresses a metabolic panel to focus on her blood sugar since she is complaining of dizziness at times and does have family history of diabetes since she is fasting today. We will check her CBC because her history of anemia in the past as well their we will also check her TSH and urinalysis. Pap smear will not be done today. Patient was reminded on the importance of monthly self breast examination. We discussed importance of taking snacks in between meals in the event that she may be getting hypoglycemic at times. If she does that and the above labs are normal we need to refer her to a neurologist for further evaluation. She will return back in 6 months for a fasting lipid profile and liver function tests to monitor her hyperlipidemia. She received her flu vaccine at work. For her onychomycosis she was prescribed Butenafine HCl 1% cream to apply twice a day for 1 week not to exceed 4 weeks.   Terrance Mass MD, 3:31 PM 01/11/2014

## 2014-01-11 NOTE — Patient Instructions (Addendum)
Tia de las uas (Ringworm, Nail) Usted presenta una infeccin por hongos en las uas de los pies. La parte visible de las uas est formada por clulas muertas que no tienen suministro sanguneo que intervenga en la prevencin de las infecciones. La infeccin se produce debido a que los hongos estn en todas partes. Aprovecharn cualquier oportunidad para crecer Museum/gallery curator. Esto incluye los tejidos de su cuerpo formados por [employer].  [employer] uas tienen un crecimiento muy lento, requieren [place] 2 aos de tratamiento con medicamentos antimicticos. La infeccin involucra a toda la ua, hasta la base. Incluye aproximadamente 1/3 de la ua que no puede verse. Si el profesional le ha prescrito un medicamento por boca, [employer]. No podr ver ningn progreso hasta que hayan transcurrido entre 6 y 9 meses. No debe preocuparse. La curacin es lenta. Se debe a que el medicamento llega hasta la infeccin de manera muy lenta. Los hongos pueden vivir sobre las clulas muertas con poca o casi ninguna exposicin al suministro de [place]. Esta tambin es la razn por la cual no se observa mejora en los primeros 6 meses. La ua comienza la curacin en la base, donde hay suministro de [place]. La medicacin tpica, como las cremas y los ungentos generalmente no son eficaces. Nicholaus Corolla al profesional podrn elegir acelerar el proceso de curacin con la extraccin quirrgica de todas las uas. An as, Armed forces training and education officer 6 y 9 meses de medicamentos por va oral adicionales. Concurra a la Publishing copy profesional que lo asiste de acuerdo a lo que le haya indicado. Recuerde que no observar mejora durante al menos 6 meses. Consulte antes con el profesional si aparecen otros signos de infeccin (p. ej. enrojecimiento e hinchazn). Document Released: 10/09/2004 Document Revised: 03/24/2011 [facility] Patient Information 2015 [place]. This information is not intended to replace  advice given to you by your health care provider. Make sure you discuss any questions you have with your health care provider    .Monilial Vaginitis Vaginitis in a soreness, swelling and redness (inflammation) of the vagina and vulva. Monilial vaginitis is not a sexually transmitted infection. CAUSES  Yeast vaginitis is caused by yeast (candida) that is normally found in your vagina. With a yeast infection, the candida has overgrown in number to a point that upsets the chemical balance. SYMPTOMS   White, thick vaginal discharge.  Swelling, itching, redness and irritation of the vagina and possibly the lips of the vagina (vulva).  Burning or painful urination.  Painful intercourse. DIAGNOSIS  Things that may contribute to monilial vaginitis are:  Postmenopausal and virginal states.  Pregnancy.  Infections.  Being tired, sick or stressed, especially if you had monilial vaginitis in the past.  Diabetes. Good control will help lower the chance.  Birth control pills.  Tight fitting garments.  Using bubble bath, feminine sprays, douches or deodorant tampons.  Taking certain medications that kill germs (antibiotics).  Sporadic recurrence can occur if you become ill. TREATMENT  Your caregiver will give you medication.  There are several kinds of anti monilial vaginal creams and suppositories specific for monilial vaginitis. For recurrent yeast infections, use a suppository or cream in the vagina 2 times a week, or as directed.  Anti-monilial or steroid cream for the itching or irritation of the vulva may also be used. Get your caregiver's permission.  Painting the vagina with methylene blue solution may help if the monilial cream does not work.  Eating yogurt may help prevent monilial  vaginitis. HOME CARE INSTRUCTIONS   Finish all medication as prescribed.  Do not have sex until treatment is completed or after your caregiver tells you it is okay.  Take warm sitz  baths.  Do not douche.  Do not use tampons, especially scented ones.  Wear cotton underwear.  Avoid tight pants and panty hose.  Tell your sexual partner that you have a yeast infection. They should go to their caregiver if they have symptoms such as mild rash or itching.  Your sexual partner should be treated as well if your infection is difficult to eliminate.  Practice safer sex. Use condoms.  Some vaginal medications cause latex condoms to fail. Vaginal medications that harm condoms are:  Cleocin cream.  Butoconazole (Femstat).  Terconazole (Terazol) vaginal suppository.  Miconazole (Monistat) (may be purchased over the counter). SEEK MEDICAL CARE IF:   You have a temperature by mouth above 102 F (38.9 C).  The infection is getting worse after 2 days of treatment.  The infection is not getting better after 3 days of treatment.  You develop blisters in or around your vagina.  You develop vaginal bleeding, and it is not your menstrual period.  You have pain when you urinate.  You develop intestinal problems.  You have pain with sexual intercourse. Document Released: 10/09/2004 Document Revised: 03/24/2011 Document Reviewed: 06/23/2008 [facility] Patient Information 2015 [place], [place]. This information is not intended to replace advice given to you by your health care provider. Make sure you discuss any questions you have with your health care provider.

## 2014-01-12 ENCOUNTER — Other Ambulatory Visit: Payer: Self-pay | Admitting: Gynecology

## 2014-01-12 DIAGNOSIS — E7800 Pure hypercholesterolemia, unspecified: Secondary | ICD-10-CM

## 2014-01-12 LAB — URINALYSIS W MICROSCOPIC + REFLEX CULTURE
BACTERIA UA: NONE SEEN
BILIRUBIN URINE: NEGATIVE
CASTS: NONE SEEN
CRYSTALS: NONE SEEN
Glucose, UA: NEGATIVE mg/dL
KETONES UR: NEGATIVE mg/dL
Leukocytes, UA: NEGATIVE
Nitrite: NEGATIVE
PH: 6.5 (ref 5.0–8.0)
Protein, ur: NEGATIVE mg/dL
Specific Gravity, Urine: <1.005 — ABNORMAL LOW (ref 1.005–1.030)
Squamous Epithelial / LPF: NONE SEEN
Urobilinogen, UA: 0.2 mg/dL (ref 0.0–1.0)

## 2014-01-12 LAB — TSH: TSH: 1.606 u[IU]/mL (ref 0.350–4.500)

## 2014-02-23 ENCOUNTER — Other Ambulatory Visit: Payer: Self-pay

## 2014-02-23 DIAGNOSIS — Z1231 Encounter for screening mammogram for malignant neoplasm of breast: Secondary | ICD-10-CM

## 2014-03-08 ENCOUNTER — Ambulatory Visit
Admission: RE | Admit: 2014-03-08 | Discharge: 2014-03-08 | Disposition: A | Discharge status: Home / Self Care | Payer: BLUE CROSS/BLUE SHIELD | Source: Ambulatory Visit

## 2014-03-08 DIAGNOSIS — Z1231 Encounter for screening mammogram for malignant neoplasm of breast: Secondary | ICD-10-CM

## 2014-04-12 ENCOUNTER — Other Ambulatory Visit: Payer: Self-pay

## 2014-04-12 DIAGNOSIS — D649 Anemia, unspecified: Secondary | ICD-10-CM

## 2014-04-12 DIAGNOSIS — E785 Hyperlipidemia, unspecified: Secondary | ICD-10-CM

## 2014-04-12 LAB — CBC WITH DIFFERENTIAL/PLATELET
BASOS ABS: 0.1 10*3/uL (ref 0.0–0.1)
BASOS PCT: 1 % (ref 0–1)
EOS ABS: 0.8 10*3/uL — AB (ref 0.0–0.7)
Eosinophils Relative: 8 % — ABNORMAL HIGH (ref 0–5)
HCT: 34.2 % — ABNORMAL LOW (ref 36.0–46.0)
Hemoglobin: 11.3 g/dL — ABNORMAL LOW (ref 12.0–15.0)
LYMPHS PCT: 26 % (ref 12–46)
Lymphs Abs: 2.8 10*3/uL (ref 0.7–4.0)
MCH: 24.4 pg — ABNORMAL LOW (ref 26.0–34.0)
MCHC: 33 g/dL (ref 30.0–36.0)
MCV: 73.9 fL — AB (ref 78.0–100.0)
MONOS PCT: 7 % (ref 3–12)
MPV: 9.8 fL (ref 8.6–12.4)
Monocytes Absolute: 0.7 10*3/uL (ref 0.1–1.0)
Neutro Abs: 6.1 10*3/uL (ref 1.7–7.7)
Neutrophils Relative %: 58 % (ref 43–77)
Platelets: 380 10*3/uL (ref 150–400)
RBC: 4.63 MIL/uL (ref 3.87–5.11)
RDW: 14.9 % (ref 11.5–15.5)
WBC: 10.6 10*3/uL — ABNORMAL HIGH (ref 4.0–10.5)

## 2014-04-12 LAB — LIPID PANEL
Cholesterol: 195 mg/dL (ref 0–200)
HDL: 63 mg/dL (ref >=46)
LDL Cholesterol: 120 mg/dL — ABNORMAL HIGH (ref 0–99)
Total CHOL/HDL Ratio: 3.1 Ratio
Triglycerides: 61 mg/dL (ref <150)
VLDL: 12 mg/dL (ref 0–40)

## 2014-04-12 LAB — ALT: ALT: 15 U/L (ref 0–35)

## 2014-04-12 LAB — AST: AST: 22 U/L (ref 0–37)

## 2014-04-13 ENCOUNTER — Encounter: Payer: Self-pay | Admitting: Gynecology

## 2015-01-17 ENCOUNTER — Ambulatory Visit (INDEPENDENT_AMBULATORY_CARE_PROVIDER_SITE_OTHER): Payer: BLUE CROSS/BLUE SHIELD | Admitting: Gynecology

## 2015-01-17 ENCOUNTER — Encounter: Payer: Self-pay | Admitting: Gynecology

## 2015-01-17 ENCOUNTER — Other Ambulatory Visit (HOSPITAL_COMMUNITY)
Admission: RE | Admit: 2015-01-17 | Discharge: 2015-01-17 | Disposition: A | Discharge status: Home / Self Care | Payer: BLUE CROSS/BLUE SHIELD | Source: Ambulatory Visit | Attending: Gynecology | Admitting: Gynecology

## 2015-01-17 VITALS — BP 120/84 | Ht 58.5 in | Wt 157.0 lb

## 2015-01-17 DIAGNOSIS — E785 Hyperlipidemia, unspecified: Secondary | ICD-10-CM

## 2015-01-17 DIAGNOSIS — Z01419 Encounter for gynecological examination (general) (routine) without abnormal findings: Secondary | ICD-10-CM | POA: Diagnosis not present

## 2015-01-17 DIAGNOSIS — D251 Intramural leiomyoma of uterus: Secondary | ICD-10-CM | POA: Diagnosis not present

## 2015-01-17 DIAGNOSIS — Z1151 Encounter for screening for human papillomavirus (HPV): Secondary | ICD-10-CM | POA: Insufficient documentation

## 2015-01-17 DIAGNOSIS — B351 Tinea unguium: Secondary | ICD-10-CM

## 2015-01-17 LAB — COMPREHENSIVE METABOLIC PANEL
ALT: 14 U/L (ref 6–29)
AST: 18 U/L (ref 10–30)
Albumin: 3.8 g/dL (ref 3.6–5.1)
Alkaline Phosphatase: 64 U/L (ref 33–115)
BILIRUBIN TOTAL: 0.4 mg/dL (ref 0.2–1.2)
BUN: 10 mg/dL (ref 7–25)
CALCIUM: 9.1 mg/dL (ref 8.6–10.2)
CO2: 24 mmol/L (ref 20–31)
Chloride: 103 mmol/L (ref 98–110)
Creat: 0.58 mg/dL (ref 0.50–1.10)
GLUCOSE: 81 mg/dL (ref 65–99)
Potassium: 3.7 mmol/L (ref 3.5–5.3)
Sodium: 136 mmol/L (ref 135–146)
Total Protein: 7 g/dL (ref 6.1–8.1)

## 2015-01-17 LAB — CBC WITH DIFFERENTIAL/PLATELET
BASOS PCT: 1 % (ref 0–1)
Basophils Absolute: 0.1 10*3/uL (ref 0.0–0.1)
Eosinophils Absolute: 1 10*3/uL — ABNORMAL HIGH (ref 0.0–0.7)
Eosinophils Relative: 10 % — ABNORMAL HIGH (ref 0–5)
HEMATOCRIT: 35.5 % — AB (ref 36.0–46.0)
Hemoglobin: 11.6 g/dL — ABNORMAL LOW (ref 12.0–15.0)
LYMPHS ABS: 2.9 10*3/uL (ref 0.7–4.0)
LYMPHS PCT: 30 % (ref 12–46)
MCH: 24.3 pg — ABNORMAL LOW (ref 26.0–34.0)
MCHC: 32.7 g/dL (ref 30.0–36.0)
MCV: 74.4 fL — ABNORMAL LOW (ref 78.0–100.0)
MPV: 9.9 fL (ref 8.6–12.4)
Monocytes Absolute: 0.6 10*3/uL (ref 0.1–1.0)
Monocytes Relative: 6 % (ref 3–12)
NEUTROS ABS: 5 10*3/uL (ref 1.7–7.7)
NEUTROS PCT: 53 % (ref 43–77)
Platelets: 355 10*3/uL (ref 150–400)
RBC: 4.77 MIL/uL (ref 3.87–5.11)
RDW: 15.2 % (ref 11.5–15.5)
WBC: 9.5 10*3/uL (ref 4.0–10.5)

## 2015-01-17 LAB — TSH: TSH: 2.824 u[IU]/mL (ref 0.350–4.500)

## 2015-01-17 LAB — LIPID PANEL
CHOL/HDL RATIO: 3.6 ratio (ref <=5.0)
Cholesterol: 231 mg/dL — ABNORMAL HIGH (ref 125–200)
HDL: 64 mg/dL (ref >=46)
LDL Cholesterol: 150 mg/dL — ABNORMAL HIGH (ref <130)
Triglycerides: 85 mg/dL (ref <150)
VLDL: 17 mg/dL (ref <30)

## 2015-01-17 MED ORDER — CICLOPIROX 8 % EX SOLN: Freq: Every day | CUTANEOUS | Status: DC

## 2015-01-17 NOTE — Progress Notes (Signed)
Helen Mccarthy 1970-10-17 [id number]   History:    45 y.o.  for annual gyn exam with persistent complaining of right index finger fungus. Last year she had been prescribed Butenafine HCL 1 % with minimal resolution. 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 Patient also has been under treatment for hyperlipidemia for which she's currently on Lipitor 10 mg daily. Patient is fasting today and is here for her lab work as well. Patient also with past history of fibroid uterus. She's had 4 pregnancies of which 4 were delivered by cesarean section to in [place] in 2 and [place] and has had a tubal ligation with the last cesarean section. She reports normal cycles. Patient reports no past history of abnormal Pap smears.  Ultrasound done 08/24/2013:uterus measured 10.5 x 8.3 x 6.2 cm with endometrial stripe of 13.9 mm patient is a few days and starting her menses. Patient had 4 fibroids and since one was suggestive of being submucosally a sonohysterogram was done and there was no submucous myoma.   Past medical history,surgical history, family history and social history were all reviewed and documented in the EPIC chart.  Gynecologic History Patient's last menstrual period was 12/20/2014. Contraception: tubal ligation Last Pap: 2013. Results were: normal Last mammogram: 2016. Results were: normal  Obstetric History OB History  Gravida Para Term Preterm AB SAB TAB Ectopic Multiple Living  4 4 4       4     # Outcome Date GA Lbr Len/2nd Weight Sex Delivery Anes PTL Lv  4 Term     F CS-Unspec  N Y  3 Term     F CS-Unspec  N Y  2 Term     M CS-Unspec  N Y  1 Term     M CS-Unspec  N Y     Comments: DECEASED THREE DAYS AFTER BEING BORN-        ROS: A ROS was performed and pertinent positives and negatives are included in the history.  GENERAL: No fevers or chills. HEENT: No change in vision, no earache, sore throat or sinus congestion. NECK: No pain or  stiffness. CARDIOVASCULAR: No chest pain or pressure. No palpitations. PULMONARY: No shortness of breath, cough or wheeze. GASTROINTESTINAL: No abdominal pain, nausea, vomiting or diarrhea, melena or bright red blood per rectum. GENITOURINARY: No urinary frequency, urgency, hesitancy or dysuria. MUSCULOSKELETAL: No joint or muscle pain, no back pain, no recent trauma. DERMATOLOGIC: No rash, no itching, no lesions. ENDOCRINE: No polyuria, polydipsia, no heat or cold intolerance. No recent change in weight. HEMATOLOGICAL: No anemia or easy bruising or bleeding. NEUROLOGIC: No headache, seizures, numbness, tingling or weakness. PSYCHIATRIC: No depression, no loss of interest in normal activity or change in sleep pattern.     Exam: chaperone present  BP 120/84 mmHg  Ht 4' 10.5" (1.486 m)  Wt 157 lb (71.215 kg)  BMI 32.25 kg/m2  LMP 12/20/2014  Body mass index is 32.25 kg/(m^2).  General appearance : Well developed well nourished female. No acute distress HEENT: Eyes: no retinal hemorrhage or exudates,  Neck supple, trachea midline, no carotid bruits, no thyroidmegaly Lungs: Clear to auscultation, no rhonchi or wheezes, or rib retractions  Heart: Regular rate and rhythm, no murmurs or gallops Breast:Examined in sitting and supine position were symmetrical in appearance, no palpable masses or tenderness,  no skin retraction, no nipple inversion, no nipple discharge, no skin discoloration, no axillary or supraclavicular lymphadenopathy Abdomen:  no palpable masses or tenderness, no rebound or guarding Extremities: no edema or skin discoloration or tenderness  Pelvic:  Bartholin, Urethra, Skene Glands: Within normal limits             Vagina: No gross lesions or discharge  Cervix: No gross lesions or discharge  Uterus  12 week size irregular uterus  Adnexa  Without masses or tenderness  Anus and perineum  normal   Rectovaginal  normal sphincter tone without palpated masses or tenderness              Hemoccult not indicated     Assessment/Plan:  45 y.o. female for annual exam will return back to the office in 1-2 weeks for an ultrasound for follow-up on her fibroid uterus which appears to have grown in the past 18 months. The following screening labs will be ordered today: Comprehensive metabolic panel, fasting lipid profile, TSH, CBC, and urinalysis. Pap smear with HPV screening done today. Patient to schedule her mammogram 3-D in the next couple of months since she had dense breasts noted on her last mammogram. We discussed also importance of monthly breast exam. For her right index finger onychomycosis I'm going to prescribe Penlac 8% female lack or solution to apply daily at bedtime for one week.  Terrance Mass MD, 2:37 PM 01/17/2015

## 2015-01-17 NOTE — Addendum Note (Signed)
Addended by: Thurnell Garbe A on: 01/17/2015 02:54 PM   Modules accepted: Orders, SmartSet

## 2015-01-17 NOTE — Patient Instructions (Addendum)
Ciclopirox nail solution Qu es este medicamento? La SOLUCIN DE CICLOPIROX PARA LAS UAS es un medicamento antimictico. Se utiliza en el tratamiento de infecciones micticas de las uas. Este medicamento puede ser utilizado para otros usos; si tiene alguna pregunta consulte con su proveedor de atencin mdica o con su farmacutico. Qu le debo informar a mi profesional de la salud antes de tomar este medicamento? Necesita saber si usted presenta alguno de los [employer] o situaciones: -diabetes mellitus -antecedentes de convulsiones -infeccin por VIH -problemas del sistema inmunolgico o transplante de rganos -grandes zonas de piel quemada o con lesiones -enfermedad vascular perifrica o mala circulacin -est tomando corticoesteroides (incluso si est utilizando inhaladores, crema o locin de esteroides) -una reaccin alrgica o inusual al ciclopirox, al alcohol isoproplico, a otros medicamentos, alimentos, colorantes o conservantes -si est embarazada o buscando quedar embarazada -si est amamantando a un beb Cmo debo utilizar este medicamento? Este medicamento es slo para uso externo. No lo tome por va oral y evite que entre en contacto con los ojos, la boca o la [place]. Si entra en contacto con sus ojos, enjuguelos con abundante agua fra del grifo. Comunquese con su mdico o profesional de la salud si experimenta irritacin de los ojos. Siga correctamente las instrucciones de la etiqueta del [place]. Lvese y squese las manos antes de usarlo. Utilcelo a intervalos regulares. No utilice sus medicamentos con una frecuencia mayor que la indicada. Complete todo el tratamiento con el medicamento segn lo haya recetado su mdico o su profesional de la salud, aun si considera que su problema ha mejorado. No interrumpa su uso excepto si as lo indica su mdico. Hable con su pediatra para informarse acerca del uso de este medicamento en nios. Aunque este medicamento ha  sido recetado a nios tan menores como de 12 aos de edad para condiciones selectivas, las precauciones se aplican. Sobredosis: Pngase en contacto inmediatamente con un centro toxicolgico o una sala de urgencia si usted cree que haya tomado demasiado medicamento. ATENCIN: [employer] es solo para usted. No comparta este medicamento con nadie. Qu sucede si me olvido de una dosis? Si olvida una dosis, aplquela lo antes posible. Si es casi la hora de la prxima dosis, aplique slo esa dosis. No use dosis adicionales o dobles. Qu puede interactuar con este medicamento? No se esperan interacciones. No utilice otros productos para la piel sin consultar a su mdico o a su profesional de Technical sales engineer. Puede ser que esta lista no menciona todas las posibles interacciones. Informe a su profesional de [employer]	[place] de [employer] productos a base de hierbas, medicamentos de [place] o suplementos nutritivos que est tomando. Si usted fuma, consume bebidas alcohlicas o si utiliza drogas ilegales, indqueselo tambin a su profesional de [employer]	[place]. Algunas sustancias pueden interactuar con su medicamento. A qu debo estar atento al usar [employer]? Si sus sntomas empeoran, informe a su mdico o a su profesional de [employer]	[place]. Debern transcurrir de cuatro a seis meses de tratamiento para que se pueda observar una mejora en la ua o las uas. Algunas Designer, television/film set an ms tiempo antes de que se observe una cura o limpieza completa. Informe a su mdico o a su profesional de la salud si desarrolla llagas o ampollas que no se cicatrizan adecuadamente. Si la infeccin de la ua reaparece despus de dejar de [employer] producto, comunquese con su mdico o su profesional de Technical sales engineer. Qu efectos secundarios puedo tener al [employer] este medicamento? Efectos  secundarios que debe informar a su mdico o a Barrister's clerk de la salud tan pronto como sea posible: -reacciones alrgicas como erupcin  cutnea, picazn o urticarias, hinchazn de la cara, labios o lengua -irritacin severa, enrojecimiento, picazn, ardor, ampollas, descamacin, hinchazn, supuracin Efectos secundarios que, por lo general, no requieren atencin mdica (debe informarlos a su mdico o a su profesional de la salud si persisten o si son molestos): -enrojecimiento leve de la piel -decoloracin de la ua -leve ardor o escozor temporal en el lugar de la aplicacin Puede ser que esta lista no menciona todos los posibles efectos secundarios. Comunquese a su mdico por asesoramiento mdico [employer] Inc. Usted puede informar los efectos secundarios a la FDA por telfono al 1-800-FDA-1088. Dnde debo guardar mi medicina? Mantngala fuera del alcance de los nios. Gurdela a [employer], entre 15 y 55 grados C (19 y 96 grados F). No la congele. Protjala de la luz, guardando el frasco en la caja despus de cada uso. Este medicamento es inflamable. Mantngalo lejos del calor y de las llamas. Deseche todo el medicamento que no haya utilizado, despus de la fecha de vencimiento. ATENCIN: Este folleto es un resumen. Puede ser que no cubra toda la posible informacin. Si usted tiene preguntas acerca de esta medicina, consulte con su mdico, su farmacutico o su profesional de Technical sales engineer.    2016, Elsevier/Gold Standard. (2014-02-21 00:00:00) [place] uas (Nail Ringworm) Usted presenta una infeccin por hongos en las uas de los pies. La parte visible de las uas est formada por clulas muertas que no tienen suministro sanguneo que intervenga en la prevencin de las infecciones. La infeccin se produce debido a que los hongos estn en todas partes. Aprovecharn cualquier oportunidad para crecer Museum/gallery curator. Esto incluye los tejidos de su cuerpo formados por [employer].  [employer] uas tienen un crecimiento muy lento, requieren [place] 2 aos de tratamiento con medicamentos antimicticos.  La infeccin involucra a toda la ua, hasta la base. Incluye aproximadamente 1/3 de la ua que no puede verse. Si el profesional le ha prescrito un medicamento por boca, [employer]. No podr ver ningn progreso hasta que hayan transcurrido entre 6 y 9 meses. No debe preocuparse. La curacin es lenta. Se debe a que el medicamento llega hasta la infeccin de manera muy lenta. Los hongos pueden vivir sobre las clulas muertas con poca o casi ninguna exposicin al suministro de [employer]. Esta tambin es la razn por la cual no se observa mejora en los primeros 6 meses. La ua comienza la curacin en la base, donde hay suministro de [place]. La medicacin tpica, como las cremas y los ungentos generalmente no son eficaces. Nicholaus Corolla al profesional podrn elegir acelerar el proceso de curacin con la extraccin quirrgica de todas las uas. An as, Armed forces training and education officer 6 y 9 meses de medicamentos por va oral adicionales. Concurra a la Publishing copy profesional que lo asiste de acuerdo a lo que le haya indicado. Recuerde que no observar mejora durante al menos 6 meses. Consulte antes con el profesional si aparecen otros signos de infeccin (p. ej. enrojecimiento e hinchazn).   Esta informacin no tiene Marine scientist el consejo del mdico. Asegrese de hacerle al mdico cualquier pregunta que tenga.   Document Released: 10/09/2004 Document Revised: 05/16/2014 Elsevier Interactive Patient Education 2016 [place] uterinos (Uterine Fibroids) Los fibromas uterinos son masas (tumores) de tejido que pueden desarrollarse en el vientre (tero). Tambin se  los [employer]. Este tipo de tumor no es Radio broadcast assistant (benigno) y no se disemina a Airline pilot del cuerpo fuera de la zona plvica, la cual se encuentra entre los huesos de la cadera. En ocasiones, los fibromas pueden crecer en las trompas de [place], en el cuello del tero o en las estructuras de soporte (ligamentos)  que rodean el tero. Una mujer puede tener uno o ms fibromas. Los fibromas pueden tener diferente tamao y [place], y crecer en distintas partes del tero. Algunos pueden crecer hasta volverse bastante grandes. La mayora no requiere tratamiento mdico. CAUSAS Un fibroma puede desarrollarse cuando una nica clula uterina contina creciendo (se multiplica). La mayora de las clulas del cuerpo humano tienen un mecanismo de control que impide que se multipliquen sin control. [place] los sntomas se pueden incluir los siguientes:   Hemorragias intensas durante la menstruacin.  Prdidas de sangre o [employer].  Dolor y opresin en la pelvis.  Problemas de la vejiga, como necesidad de Garment/textile technologist con ms frecuencia (polaquiuria) o necesidad imperiosa de Garment/textile technologist.  Incapacidad para reproducir (infertilidad).  Abortos espontneos. DIAGNSTICO Los fibromas uterinos se diagnostican con un examen fsico. El mdico puede palpar los tumores grumosos durante un examen plvico. Pueden realizarse ecografas y Ardelia Mems resonancia magntica para determinar el tamao y la ubicacin de los fibromas, as como la cantidad. TRATAMIENTO El tratamiento puede incluir lo siguiente:  Observacin cautelosa. Esto requiere que el mdico controle el fibroma para saber si crece o se achica. Siga las recomendaciones del mdico respecto de la frecuencia con la que debe realizarse los controles.  Medicamentos hormonales. Pueden tomarse por va oral o administrarse a travs de un dispositivo intrauterino (DIU).  Cir[place].  Extirpacin de los fibromas (miomectoma) o del tero (histerectoma).  Suprimir la irrigacin sangunea a los fibromas (embolizacin de la arteria uterina). Si los fibromas le traen problemas de fertilidad y tiene deseos de quedar [place], el mdico puede recomendar su extirpacin.  INSTRUCCIONES PARA EL CUIDADO EN EL HOGAR  Concurra a todas las visitas de control como se  lo haya indicado el mdico. Esto es importante.  Tome los medicamentos solamente como se lo haya indicado el mdico.  Si le recetaron un tratamiento hormonal, tome los medicamentos hormonales exactamente como se lo indicaron.  No tome aspirina, ya que puede causar hemorragias.  Consulte al [employer] sobre tomar comprimidos de hierro y Garment/textile technologist la cantidad de verduras de hoja color verde oscuro en la dieta. Estas medidas pueden ayudar a Transport planner de hierro en la [place], que pueden verse afectados por las hemorragias menstruales intensas.  Preste mucha atencin a Hydrographic surveyor e informe al mdico si hay algn cambio, por ejemplo:  Aumento del flujo de sangre que le exige el uso de ms compresas o tampones que los que utiliza normalmente cada mes.  Un cambio en la cantidad de [employer] dura la menstruacin cada mes.  Un cambio en los sntomas asociados con la [place], como clicos abdominales o dolor de espalda. SOLICITE ATENCIN MDICA SI:  Tiene dolor plvico, dolor de espalda o clicos abdominales que los medicamentos no Engineer, petroleum.  Observa un aumento del sangrado entre y [employer].  Empapa los tampones o las compresas en el trmino de media hora o [place].  Se siente mareada, muy cansada o dbil. SOLICITE ATENCIN MDICA DE INMEDIATO SI:  Se desmaya.  El dolor plvico aumenta repentinamente.   Esta informacin no tiene Psychologist, clinical  consejo del mdico. Asegrese de hacerle al mdico cualquier pregunta que tenga.   Document Released: 12/30/2004 Document Revised: 01/20/2014 Elsevier Interactive Patient Education [employer].

## 2015-01-18 LAB — URINALYSIS W MICROSCOPIC + REFLEX CULTURE
Bilirubin Urine: NEGATIVE
CASTS: NONE SEEN [LPF]
CRYSTALS: NONE SEEN [HPF]
Glucose, UA: NEGATIVE
KETONES UR: NEGATIVE
Leukocytes, UA: NEGATIVE
NITRITE: NEGATIVE
PH: 6 (ref 5.0–8.0)
PROTEIN: NEGATIVE
Specific Gravity, Urine: 1.004 (ref 1.001–1.035)
Yeast: NONE SEEN [HPF]

## 2015-01-18 MED ORDER — ATORVASTATIN CALCIUM 10 MG PO TABS: 10.0000 mg | ORAL_TABLET | Freq: Every day | ORAL | Status: DC

## 2015-01-18 NOTE — Addendum Note (Signed)
Addended by: Terrance Mass on: 01/18/2015 06:53 AM   Modules accepted: Orders

## 2015-01-19 LAB — URINE CULTURE

## 2015-01-22 LAB — CYTOLOGY - PAP

## 2015-01-23 ENCOUNTER — Other Ambulatory Visit: Payer: Self-pay | Admitting: Gynecology

## 2015-01-23 DIAGNOSIS — R3129 Other microscopic hematuria: Secondary | ICD-10-CM

## 2015-02-07 ENCOUNTER — Encounter: Payer: Self-pay | Admitting: Gynecology

## 2015-02-07 ENCOUNTER — Ambulatory Visit (INDEPENDENT_AMBULATORY_CARE_PROVIDER_SITE_OTHER): Payer: BLUE CROSS/BLUE SHIELD

## 2015-02-07 ENCOUNTER — Other Ambulatory Visit: Payer: Self-pay | Admitting: Gynecology

## 2015-02-07 ENCOUNTER — Ambulatory Visit (INDEPENDENT_AMBULATORY_CARE_PROVIDER_SITE_OTHER): Payer: BLUE CROSS/BLUE SHIELD | Admitting: Gynecology

## 2015-02-07 VITALS — BP 124/78

## 2015-02-07 DIAGNOSIS — D250 Submucous leiomyoma of uterus: Secondary | ICD-10-CM

## 2015-02-07 DIAGNOSIS — N831 Corpus luteum cyst of ovary, unspecified side: Secondary | ICD-10-CM

## 2015-02-07 DIAGNOSIS — D251 Intramural leiomyoma of uterus: Secondary | ICD-10-CM

## 2015-02-07 DIAGNOSIS — R9389 Abnormal findings on diagnostic imaging of other specified body structures: Secondary | ICD-10-CM

## 2015-02-07 DIAGNOSIS — R3129 Other microscopic hematuria: Secondary | ICD-10-CM

## 2015-02-07 DIAGNOSIS — B351 Tinea unguium: Secondary | ICD-10-CM | POA: Diagnosis not present

## 2015-02-07 DIAGNOSIS — R938 Abnormal findings on diagnostic imaging of other specified body structures: Secondary | ICD-10-CM

## 2015-02-07 DIAGNOSIS — D500 Iron deficiency anemia secondary to blood loss (chronic): Secondary | ICD-10-CM | POA: Diagnosis not present

## 2015-02-07 LAB — URINALYSIS W MICROSCOPIC + REFLEX CULTURE
Bilirubin Urine: NEGATIVE
CRYSTALS: NONE SEEN [HPF]
Casts: NONE SEEN [LPF]
GLUCOSE, UA: NEGATIVE
Ketones, ur: NEGATIVE
Leukocytes, UA: NEGATIVE
NITRITE: NEGATIVE
PROTEIN: NEGATIVE
SPECIFIC GRAVITY, URINE: 1.01 (ref 1.001–1.035)
Yeast: NONE SEEN [HPF]
pH: 5.5 (ref 5.0–8.0)

## 2015-02-07 MED ORDER — CICLOPIROX 8 % EX SOLN: Freq: Every day | CUTANEOUS | Status: DC

## 2015-02-07 NOTE — Patient Instructions (Signed)
Tia de las uas (Nail Ringworm) Usted presenta una infeccin por hongos en las uas de los pies. La parte visible de las uas est formada por clulas muertas que no tienen suministro sanguneo que intervenga en la prevencin de las infecciones. La infeccin se produce debido a que los hongos estn en todas partes. Aprovecharn cualquier oportunidad para crecer Museum/gallery curator. Esto incluye los tejidos de su cuerpo formados por [employer].  [employer] uas tienen un crecimiento muy lento, requieren [place] 2 aos de tratamiento con medicamentos antimicticos. La infeccin involucra a toda la ua, hasta la base. Incluye aproximadamente 1/3 de la ua que no puede verse. Si el profesional le ha prescrito un medicamento por boca, [employer] No podr ver ningn progreso hasta que hayan transcurrido entre 6 y 9 meses. No debe preocuparse. La curacin es lenta. Se debe a que el medicamento llega hasta la infeccin de manera muy lenta. Los hongos pueden vivir sobre las clulas muertas con poca o casi ninguna exposicin al suministro de [place]. Esta tambin es la razn por la cual no se observa mejora en los primeros 6 meses. La ua comienza la curacin en la base, donde hay suministro de [place]. La medicacin tpica, como las cremas y los ungentos generalmente no son eficaces. Nicholaus Corolla al profesional podrn elegir acelerar el proceso de curacin con la extraccin quirrgica de todas las uas. An as, Armed forces training and education officer 6 y 9 meses de medicamentos por va oral adicionales. Concurra a la Publishing copy profesional que lo asiste de acuerdo a lo que le haya indicado. Recuerde que no observar mejora durante al menos 6 meses. Consulte antes con el profesional si aparecen otros signos de infeccin (p. ej. enrojecimiento e hinchazn).   Esta informacin no tiene Marine scientist el consejo del mdico. Asegrese de hacerle al mdico cualquier pregunta que tenga.   Document Released:  10/09/2004 Document Revised: 05/16/2014 Elsevier Interactive Patient Education 2016 [place] uterinos (Uterine Fibroids) Los fibromas uterinos son masas (tumores) de tejido que pueden desarrollarse en el vientre (tero). Tambin se los [employer]. Este tipo de tumor no es Radio broadcast assistant (benigno) y no se disemina a Airline pilot del cuerpo fuera de la zona plvica, la cual se encuentra entre los huesos de la cadera. En ocasiones, los fibromas pueden crecer en las trompas de [place], en el cuello del tero o en las estructuras de soporte (ligamentos) que rodean el tero. Una mujer puede tener uno o ms fibromas. Los fibromas pueden tener diferente tamao y [place], y crecer en distintas partes del tero. Algunos pueden crecer hasta volverse bastante grandes. La mayora no requiere tratamiento mdico. CAUSAS Un fibroma puede desarrollarse cuando una nica clula uterina contina creciendo (se multiplica). La mayora de las clulas del cuerpo humano tienen un mecanismo de control que impide que se multipliquen sin control. [place] los sntomas se pueden incluir los siguientes:   Hemorragias intensas durante la menstruacin.  Prdidas de sangre o [employer].  Dolor y opresin en la pelvis.  Problemas de la vejiga, como necesidad de Garment/textile technologist con ms frecuencia (polaquiuria) o necesidad imperiosa de Garment/textile technologist.  Incapacidad para reproducir (infertilidad).  Abortos espontneos. DIAGNSTICO Los fibromas uterinos se diagnostican con un examen fsico. El mdico puede palpar los tumores grumosos durante un examen plvico. Pueden realizarse ecografas y Ardelia Mems resonancia magntica para determinar el tamao y la ubicacin de los fibromas, as como la cantidad. TRATAMIENTO El tratamiento puede incluir lo  siguiente:  Observacin cautelosa. Esto requiere que el mdico controle el fibroma para saber si crece o se achica. Siga las recomendaciones del mdico  respecto de la frecuencia con la que debe realizarse los controles.  Medicamentos hormonales. Pueden tomarse por va oral o administrarse a travs de un dispositivo intrauterino (DIU).  Ciruga.  Extirpacin de los fibromas (miomectoma) o del tero (histerectoma).  Suprimir la irrigacin sangunea a los fibromas (embolizacin de la arteria uterina). Si los fibromas le traen problemas de fertilidad y tiene deseos de quedar [place], el mdico puede recomendar su extirpacin.  INSTRUCCIONES PARA EL CUIDADO EN EL HOGAR  Concurra a todas las visitas de control como se lo haya indicado el mdico. Esto es importante.  Tome los medicamentos solamente como se lo haya indicado el mdico.  Si le recetaron un tratamiento hormonal, tome los medicamentos hormonales exactamente como se lo indicaron.  No tome aspirina, ya que puede causar hemorragias.  Consulte al [employer] sobre tomar comprimidos de hierro y Garment/textile technologist la cantidad de verduras de hoja color verde oscuro en la dieta. Estas medidas pueden ayudar a Transport planner de hierro en la [place], que pueden verse afectados por las hemorragias menstruales intensas.  Preste mucha atencin a Hydrographic surveyor e informe al mdico si hay algn cambio, por ejemplo:  Aumento del flujo de sangre que le exige el uso de ms compresas o tampones que los que utiliza normalmente cada mes.  Un cambio en la cantidad de [employer] dura la menstruacin cada mes.  Un cambio en los sntomas asociados con la [place], como clicos abdominales o dolor de espalda. SOLICITE ATENCIN MDICA SI:  Tiene dolor plvico, dolor de espalda o clicos abdominales que los medicamentos no Engineer, petroleum.  Observa un aumento del sangrado entre y [employer].  Empapa los tampones o las compresas en el trmino de media hora o [place].  Se siente mareada, muy cansada o dbil. SOLICITE ATENCIN MDICA DE INMEDIATO SI:  Se desmaya.  El dolor  plvico aumenta repentinamente.   Esta informacin no tiene Marine scientist el consejo del mdico. Asegrese de hacerle al mdico cualquier pregunta que tenga.   Document Released: 12/30/2004 Document Revised: 01/20/2014 Elsevier Interactive Patient Education [employer].

## 2015-02-07 NOTE — Progress Notes (Signed)
    patient is a 45 year old was seen in the office early this monButenafine HCL 1 % with minimal resolutionth for her annual exam and had been complaining of persistent right medial fungus an in 2000 and  Had been prescribed Butenafine HCL 1 % with minimal resolution. Patient also had been on Lipitor 10 mg daily as a result of her hyperlipidemia and her recent fasting lipid profile had demonstrated her LDL continues to be elevated at 150 and  Her total cholesterol at 231. Also her urinalysis demonstrated evidence of microscopic hematuria which her urine was repeated today and once again there was  20-40 RBC per high-power field and few bacteria for which a urine culture was submitted. Her Pap smear was normal. Patient is here also for an ultrasound due to the fact that she has a history of fibroid uterus.   ultrasound 2015 demonstrated the following: Ultrasound today uterus measured 10.5 x 8.3 x 6.2 cm with endometrial stripe of 13.9 mm patient is a few days and starting her menses. Patient had 4 fibroids and since one was suggestive of being submucosally a sonohysterogram was done  And no intracavitary defects were noted. She did have a right corpus luteum cyst measuring 24 x 21 x 24 mm.   ultrasound today: Uterus measured 11.0 x 8.9 x 6.8 cm with endometrial stripe of 10.7 mm. Enlarged fibroid uterus with intramural myomas measuring 39 x 45 mm, 17 x 8 mm, 16 x 12 mm, 0.4 x 23 mm, 21 x 17 mm questionable submucous fibroid measures 16 x 13 mm was reported. Right ovary with a corpus luteum cyst collapsed measuring 22 x 20 x 29 mm with positive color flow in the periphery. Left R was normal. No fluid in the cul-de-sac.   Assessment/plan:  #1 persistent microscopic hematuria patient will be referred to the urologist for further evaluation #2 patient with hemoglobin 11.6 on iron tablet probably attributed to her fibroid uterus patient not ready to have surgery at this time. It appears her uterine fibroids  have increased in size.  #3 patient with continued elevation her triglycerides we'll plan on changing her statin to Crestor 20 mg daily. We'll recheck fasting lipid profile in 6 months.

## 2015-02-08 ENCOUNTER — Telehealth: Payer: Self-pay | Admitting: *Deleted

## 2015-02-08 ENCOUNTER — Other Ambulatory Visit: Payer: Self-pay | Admitting: *Deleted

## 2015-02-08 DIAGNOSIS — E7800 Pure hypercholesterolemia, unspecified: Secondary | ICD-10-CM

## 2015-02-08 LAB — URINE CULTURE
Colony Count: NO GROWTH
Organism ID, Bacteria: NO GROWTH

## 2015-02-08 MED ORDER — ROSUVASTATIN CALCIUM 20 MG PO TABS: 20.0000 mg | ORAL_TABLET | Freq: Every day | ORAL | Status: DC

## 2015-02-08 NOTE — Telephone Encounter (Signed)
Spanish speaking patient, claudia will rely to patient.

## 2015-02-08 NOTE — Telephone Encounter (Signed)
-----   Message from Terrance Mass, MD sent at 02/08/2015  8:52 AM EST ----- Anderson Malta, please tell patient to stop taking her Lipitor. I have called in a new medication called Crestor 20 mg daily. I need to recheck her fasting lipid profile in 6 months. She is to continue on the medication until further notice. She was given 11 refills.

## 2015-02-09 ENCOUNTER — Telehealth: Payer: Self-pay | Admitting: *Deleted

## 2015-02-09 NOTE — Telephone Encounter (Signed)
-----   Message from Ramond Craver, [place] sent at 02/09/2015 10:20 AM EST ----- Regarding: urology referral Anderson Malta, Another [place] speaking patient. Thought you might just want to get urology appt and let [facility] call her with his recommendation and appointment at same time. She is in result notes.  Thanks!  Notes Recorded by Terrance Mass, MD on 02/09/2015 at 9:57 AM Please make an appointment for this patient with a [facility] as a result of her persistent microscopic hematuria. Please inform patient that I would like to refer her to the urologist because blood continues to be found in her urine although urine culture was negative. Please send him a copy of these results

## 2015-02-09 NOTE — Telephone Encounter (Signed)
Notes faxed, they will fax me back with time and date.

## 2015-02-12 NOTE — Telephone Encounter (Signed)
Appointment on 02/20/15 @ 9:00am with Dr.Budzyn clauda will inform patient.

## 2015-04-04 ENCOUNTER — Other Ambulatory Visit: Payer: Self-pay

## 2015-04-04 DIAGNOSIS — Z1231 Encounter for screening mammogram for malignant neoplasm of breast: Secondary | ICD-10-CM

## 2015-04-25 ENCOUNTER — Ambulatory Visit
Admission: RE | Admit: 2015-04-25 | Discharge: 2015-04-25 | Disposition: A | Discharge status: Home / Self Care | Payer: BLUE CROSS/BLUE SHIELD | Source: Ambulatory Visit

## 2015-04-25 DIAGNOSIS — Z1231 Encounter for screening mammogram for malignant neoplasm of breast: Secondary | ICD-10-CM

## 2015-08-01 ENCOUNTER — Other Ambulatory Visit: Payer: BLUE CROSS/BLUE SHIELD

## 2015-08-01 DIAGNOSIS — E7800 Pure hypercholesterolemia, unspecified: Secondary | ICD-10-CM

## 2015-08-02 LAB — LIPID PANEL
CHOLESTEROL: 222 mg/dL — AB (ref 125–200)
HDL: 64 mg/dL (ref >=46)
LDL Cholesterol: 136 mg/dL — ABNORMAL HIGH (ref <130)
Total CHOL/HDL Ratio: 3.5 Ratio (ref <=5.0)
Triglycerides: 108 mg/dL (ref <150)
VLDL: 22 mg/dL (ref <30)

## 2015-12-20 DIAGNOSIS — Z1231 Encounter for screening mammogram for malignant neoplasm of breast: Secondary | ICD-10-CM | POA: Diagnosis not present

## 2016-05-21 ENCOUNTER — Ambulatory Visit (INDEPENDENT_AMBULATORY_CARE_PROVIDER_SITE_OTHER): Payer: BLUE CROSS/BLUE SHIELD | Admitting: Gynecology

## 2016-05-21 ENCOUNTER — Encounter: Payer: Self-pay | Admitting: Gynecology

## 2016-05-21 VITALS — BP 102/76 | Ht 58.25 in | Wt 149.4 lb

## 2016-05-21 DIAGNOSIS — E782 Mixed hyperlipidemia: Secondary | ICD-10-CM

## 2016-05-21 DIAGNOSIS — D500 Iron deficiency anemia secondary to blood loss (chronic): Secondary | ICD-10-CM | POA: Diagnosis not present

## 2016-05-21 DIAGNOSIS — E663 Overweight: Secondary | ICD-10-CM

## 2016-05-21 DIAGNOSIS — D251 Intramural leiomyoma of uterus: Secondary | ICD-10-CM | POA: Diagnosis not present

## 2016-05-21 DIAGNOSIS — Z01419 Encounter for gynecological examination (general) (routine) without abnormal findings: Secondary | ICD-10-CM

## 2016-05-21 LAB — LIPID PANEL
Cholesterol: 218 mg/dL — ABNORMAL HIGH (ref <200)
HDL: 67 mg/dL (ref >50)
LDL CALC: 130 mg/dL — AB (ref <100)
Total CHOL/HDL Ratio: 3.3 Ratio (ref <5.0)
Triglycerides: 106 mg/dL (ref <150)
VLDL: 21 mg/dL (ref <30)

## 2016-05-21 LAB — COMPREHENSIVE METABOLIC PANEL
ALT: 16 U/L (ref 6–29)
AST: 24 U/L (ref 10–35)
Albumin: 4 g/dL (ref 3.6–5.1)
Alkaline Phosphatase: 60 U/L (ref 33–115)
BILIRUBIN TOTAL: 0.5 mg/dL (ref 0.2–1.2)
BUN: 12 mg/dL (ref 7–25)
CO2: 21 mmol/L (ref 20–31)
CREATININE: 0.62 mg/dL (ref 0.50–1.10)
Calcium: 9.3 mg/dL (ref 8.6–10.2)
Chloride: 103 mmol/L (ref 98–110)
GLUCOSE: 80 mg/dL (ref 65–99)
Potassium: 4 mmol/L (ref 3.5–5.3)
SODIUM: 136 mmol/L (ref 135–146)
Total Protein: 7.4 g/dL (ref 6.1–8.1)

## 2016-05-21 LAB — CBC WITH DIFFERENTIAL/PLATELET
Basophils Absolute: 104 cells/uL (ref 0–200)
Basophils Relative: 1 %
EOS PCT: 8 %
Eosinophils Absolute: 832 cells/uL — ABNORMAL HIGH (ref 15–500)
HCT: 37.4 % (ref 35.0–45.0)
HEMOGLOBIN: 12.1 g/dL (ref 11.7–15.5)
LYMPHS ABS: 2392 {cells}/uL (ref 850–3900)
LYMPHS PCT: 23 %
MCH: 24.2 pg — ABNORMAL LOW (ref 27.0–33.0)
MCHC: 32.4 g/dL (ref 32.0–36.0)
MCV: 74.9 fL — ABNORMAL LOW (ref 80.0–100.0)
MPV: 9.9 fL (ref 7.5–12.5)
Monocytes Absolute: 832 cells/uL (ref 200–950)
Monocytes Relative: 8 %
Neutro Abs: 6240 cells/uL (ref 1500–7800)
Neutrophils Relative %: 60 %
Platelets: 366 10*3/uL (ref 140–400)
RBC: 4.99 MIL/uL (ref 3.80–5.10)
RDW: 15.4 % — AB (ref 11.0–15.0)
WBC: 10.4 10*3/uL (ref 3.8–10.8)

## 2016-05-21 LAB — TSH: TSH: 3.69 m[IU]/L

## 2016-05-21 NOTE — Patient Instructions (Addendum)
Opciones de alimentos para bajar el nivel de triglicridos  (Food Choices to Lower Your Triglycerides)  Los triglicridos son un tipo de grasas que se encuentran en la sangre. Un nivel elevado de triglicridos puede aumentar el riesgo de padecer enfermedades cardacas e infartos. Si sus niveles de triglicridos son altos, los alimentos que se ingieren y los hbitos de alimentacin son muy importantes. Elegir los alimentos adecuados puede ayudar a bajar el nivel de triglicridos.  QU PAUTAS GENERALES DEBO SEGUIR?   Baje de peso si es necesario.   Limite o evite el alcohol.   Llene la mitad del plato con vegetales y ensaladas de hojas verdes.   Limite las frutas a dos porciones por da. Elija frutas en lugar de jugos.   Ocupe un cuarto del plato con cereales integrales. Busque la palabra "integral" en el primer lugar de la lista de ingredientes.   Llene un cuarto del plato con alimentos con protenas magras.   Disfrute de pescados grasos (como salmn, caballa, sardinas y atn) tres veces por semana.   Elija las grasas saludables.   Limite los alimentos con alto contenido de almidn y azcar.   Consuma ms comida casera y menos de restaurante, de buf y comida rpida.   Limite el consumo de alimentos fritos.   Cocine los alimentos utilizando mtodos que no sean la fritura.   Limite el consumo de grasas saturadas.   Verifique las listas de ingredientes para evitar alimentos con aceites parcialmente hidrogenados (grasas trans).    QU ALIMENTOS PUEDO COMER?  Cereales  Cereales integrales, como los panes de salvado o integrales, las galletas, los cereales y las pastas. Avena sin endulzar, trigo, cebada, quinua o arroz integral. Tortillas de harina de maz o de salvado.  Vegetales  Verduras frescas o congeladas (crudas, al vapor, asadas o grilladas). Ensaladas de hojas verdes.  Fruits  Frutas frescas, en conserva (en su jugo natural) o frutas congeladas.  Carnes y otros productos con protenas  Carne de  res molida (al 85% o ms magra), carne de res de animales alimentados con pastos o carne de res sin la grasa. Pollo o pavo sin piel. Carne de pollo o de pavo molida. Cerdo sin la grasa. Todos los pescados y frutos de mar. Huevos. Porotos, guisantes o lentejas secos. Frutos secos o semillas sin sal. Frijoles secos o en lata sin sal.  Lcteos  Productos lcteos con bajo contenido de grasas, como leche descremada o al 1%, quesos reducidos en grasas o al 2%, ricota con bajo contenido de grasas o queso cottage, o yogur natural con bajo contenido de grasas.  Grasas y aceites  Margarinas en barra que no contengan grasas trans. [place] y condimentos para ensaladas livianos o reducidos en grasas. [place]. Aceites de crtamo, oliva o canola. [place] natural de man o almendra.  Los artculos mencionados arriba pueden no ser una lista completa de las bebidas o los alimentos recomendados. Comunquese con el nutricionista para conocer ms opciones.  QU ALIMENTOS NO SE RECOMIENDAN?  Cereales  Pan blanco. Pastas blancas. Arroz blanco. Pan de maz. Bagels, pasteles y croissants. Galletas saladas que contengan grasas trans.  Vegetales  Papas blancas. Maz. Vegetales con crema o fritos. Verduras en salsa de queso.  Fruits  Frutas secas. Fruta enlatada en almbar liviano o espeso. Jugo de frutas.  Carnes y otros productos con protenas  Cortes de carne con grasa. Costillas, alas de pollo, tocineta, salchicha, mortadela, salame, chinchulines, tocino, perros calientes, salchichas alemanas y embutidos envasados.  Lcteos  Leche   melazas. Caramelos. Mermelada y [place]. Chrissie Noa. Cereales endulzados. Galletas, pasteles, bizcochuelos, donas, muffins y helado. Grasas y  aceites [place], [place] en barra, [place] de [place], [place], [place] clarificada o grasa de tocino. Aceites de coco, de palmiste o de palma. Bebidas Alcohol. Bebidas endulzadas (como refrescos, limonadas y bebidas frutales o ponches). Los artculos mencionados arriba pueden no ser [employer] de las bebidas y los alimentos que se Higher education careers adviser. Comunquese con el nutricionista para recibir ms informacin. Esta informacin no tiene Marine scientist el consejo del mdico. Asegrese de hacerle al mdico cualquier pregunta que tenga. Document Released: 06/19/2009 Document Revised: 01/04/2013 Document Reviewed: 11/03/2012 Elsevier Interactive Patient Education  2017 [place] uterinos (Uterine Fibroids) Los fibromas uterinos son masas (tumores) de tejido que pueden desarrollarse en el vientre (tero). Tambin se los [employer]. Este tipo de tumor no es Radio broadcast assistant (benigno) y no se disemina a Airline pilot del cuerpo fuera de la zona plvica, la cual se encuentra entre los huesos de la cadera. En ocasiones, los fibromas pueden crecer en las trompas de [place], en el cuello del tero o en las estructuras de soporte (ligamentos) que rodean el tero. Una mujer puede tener uno o ms fibromas. Los fibromas pueden tener diferente tamao y [place], y crecer en distintas partes del tero. Algunos pueden crecer hasta volverse bastante grandes. La mayora no requiere tratamiento mdico. CAUSAS Un fibroma puede desarrollarse cuando una nica clula uterina contina creciendo (se multiplica). La mayora de las clulas del cuerpo humano tienen un mecanismo de control que impide que se multipliquen sin control. [place] los sntomas se pueden incluir los siguientes:  Hemorragias intensas durante la menstruacin.  Prdidas de sangre o [employer].  Dolor y opresin en la pelvis.  Problemas de la vejiga, como necesidad de Garment/textile technologist con ms  frecuencia (polaquiuria) o necesidad imperiosa de Garment/textile technologist.  Incapacidad para reproducir (infertilidad).  Abortos espontneos. DIAGNSTICO Los fibromas uterinos se diagnostican con un examen fsico. El mdico puede palpar los tumores grumosos durante un examen plvico. Pueden realizarse ecografas y Ardelia Mems resonancia magntica para determinar el tamao y la ubicacin de los fibromas, as como la cantidad. TRATAMIENTO El tratamiento puede incluir lo siguiente:  Observacin cautelosa. Esto requiere que el mdico controle el fibroma para saber si crece o se achica. Siga las recomendaciones del mdico respecto de la frecuencia con la que debe realizarse los controles.  Medicamentos hormonales. Pueden tomarse por va oral o administrarse a travs de un dispositivo intrauterino (DIU).  Ciruga.  Extirpacin de los fibromas (miomectoma) o del tero (histerectoma).  Suprimir la irrigacin sangunea a los fibromas (embolizacin de la arteria uterina). Si los fibromas le traen problemas de fertilidad y tiene deseos de quedar [place], el mdico puede recomendar su extirpacin. INSTRUCCIONES PARA EL CUIDADO EN EL HOGAR  Concurra a todas las visitas de control como se lo haya indicado el mdico. Esto es importante.  Tome los medicamentos de venta libre y los recetados solamente como se lo haya indicado el mdico.  Si le recetaron un tratamiento hormonal, tome los medicamentos hormonales exactamente como se lo indicaron.  Consulte al [employer] sobre tomar comprimidos de hierro y Garment/textile technologist la cantidad de verduras de hoja color verde oscuro en la dieta. Estas medidas pueden ayudar a Transport planner de hierro en la [place], que pueden verse afectados por las hemorragias menstruales intensas.  Preste mucha atencin a la menstruacin e informe al mdico si hay algn cambio, por ejemplo:  Aumento del flujo de sangre que le exige el uso de ms compresas o tampones que los que utiliza normalmente cada  mes.  Un cambio en la cantidad de [employer] dura la menstruacin cada mes.  Un cambio en los sntomas asociados con la [place], como clicos abdominales o dolor de espalda. SOLICITE ATENCIN MDICA SI:  Tiene dolor plvico, dolor de espalda o clicos abdominales que los medicamentos no Engineer, petroleum.  Observa un aumento del sangrado entre y [employer].  Empapa los tampones o las compresas en el trmino de media hora o [place].  Se siente mareada, muy cansada o dbil. SOLICITE ATENCIN MDICA DE INMEDIATO SI:  Se desmaya.  El dolor plvico aumenta repentinamente. Esta informacin no tiene Marine scientist el consejo del mdico. Asegrese de hacerle al mdico cualquier pregunta que tenga. Document Released: 12/30/2004 Document Revised: 04/23/2015 Document Reviewed: 06/28/2013 Elsevier Interactive Patient Education  2017 [employer].

## 2016-05-21 NOTE — Progress Notes (Signed)
Helen Mccarthy Mar 01, 1970 [id number]   History:    46 y.o.  for annual gyn exam with no complaints today. Patient with history of fibroid uterus as well as hyperlipidemia. Patient had been passed on Lipitor and was switched to Crestor which she stopped taking because of headaches and dizziness. She has lost 8 pounds is last year's tried exercise and eat healthier. Patient reports no past history of abnormal Pap smears.  Previous ultrasounds as follows: Ultrasound 2015: Ultrasound today uterus measured 10.5 x 8.3 x 6.2 cm with endometrial stripe of 13.9 mm patient is a few days and starting her menses. Patient had 4 fibroids and since one was suggestive of being submucosally a sonohysterogram was done  And no intracavitary defects were noted. She did have a right corpus luteum cyst measuring 24 x 21 x 24 mm  Ultrasound 2017: Uterus measured 11.0 x 8.9 x 6.8 cm with endometrial stripe of 10.7 mm. Enlarged fibroid uterus with intramural myomas measuring 39 x 45 mm, 17 x 8 mm, 16 x 12 mm, 0.4 x 23 mm, 21 x 17 mm questionable submucous fibroid measures 16 x 13 mm was reported. Right ovary with a corpus luteum cyst collapsed measuring 22 x 20 x 29 mm with positive color flow in the periphery. Left R was normal. No fluid in the cul-de-sac.  Past medical history,surgical history, family history and social history were all reviewed and documented in the EPIC chart.  Gynecologic History Patient's last menstrual period was 05/13/2016 (approximate). Contraception: tubal ligation Last Pap: 2013 2017. Results were: normal Last mammogram: 2018. Results were: Patient see stated that she had an and a mobile unit taking to work and she had a report and was informed that with benign  Obstetric History OB History  Gravida Para Term Preterm AB Living  4 4 4     4   SAB TAB Ectopic Multiple Live Births          4    # Outcome Date GA Lbr Len/2nd Weight Sex Delivery Anes PTL Lv  4 Term     F  CS-Unspec  N LIV  3 Term     F CS-Unspec  N LIV  2 Term     M CS-Unspec  N LIV  1 Term     M CS-Unspec  N LIV     Birth Comments: DECEASED THREE DAYS AFTER BEING BORN-        ROS: A ROS was performed and pertinent positives and negatives are included in the history.  GENERAL: No fevers or chills. HEENT: No change in vision, no earache, sore throat or sinus congestion. NECK: No pain or stiffness. CARDIOVASCULAR: No chest pain or pressure. No palpitations. PULMONARY: No shortness of breath, cough or wheeze. GASTROINTESTINAL: No abdominal pain, nausea, vomiting or diarrhea, melena or bright red blood per rectum. GENITOURINARY: No urinary frequency, urgency, hesitancy or dysuria. MUSCULOSKELETAL: No joint or muscle pain, no back pain, no recent trauma. DERMATOLOGIC: No rash, no itching, no lesions. ENDOCRINE: No polyuria, polydipsia, no heat or cold intolerance. No recent change in weight. HEMATOLOGICAL: No anemia or easy bruising or bleeding. NEUROLOGIC: No headache, seizures, numbness, tingling or weakness. PSYCHIATRIC: No depression, no loss of interest in normal activity or change in sleep pattern.     Exam: chaperone present  BP 102/76   Ht 4' 10.25" (1.48 m)   Wt 149 lb 6.4 oz (67.8 kg)   LMP 05/13/2016 (Approximate)   BMI 30.96 kg/m  Body mass index is 30.96 kg/m.  General appearance : Well developed well nourished female. No acute distress HEENT: Eyes: no retinal hemorrhage or exudates,  Neck supple, trachea midline, no carotid bruits, no thyroidmegaly Lungs: Clear to auscultation, no rhonchi or wheezes, or rib retractions  Heart: Regular rate and rhythm, no murmurs or gallops Breast:Examined in sitting and supine position were symmetrical in appearance, no palpable masses or tenderness,  no skin retraction, no nipple inversion, no nipple discharge, no skin discoloration, no axillary or supraclavicular lymphadenopathy Abdomen: no palpable masses or tenderness, no rebound or  guarding Extremities: no edema or skin discoloration or tenderness  Pelvic:  [place], Urethra, Skene Glands: Within normal limits             Vagina: No gross lesions or discharge  Cervix: No gross lesions or discharge  Uterus 12-14 week size irregular nontender  Adnexa  Without masses or tenderness  Anus and perineum  normal   Rectovaginal  normal sphincter tone without palpated masses or tenderness             Hemoccult not indicated     Assessment/Plan:  46 y.o. female for annual exam with a symptomatic fibroid uterus when compared with January 2017 it appears have increased in size on clinical exam today. Patient will have a follow-up ultrasound the next one to 2 weeks here in the office. The following fasting blood work was ordered today: Comprehensive metabolic panel, fasting lipid profile, and CBC. Because her being overweight a TSH was checked today as well. Pap smear was not indicated this year.   Terrance Mass MD, 12:55 PM 05/21/2016

## 2016-05-28 ENCOUNTER — Encounter: Payer: Self-pay | Admitting: Gynecology

## 2016-05-30 DIAGNOSIS — M25561 Pain in right knee: Secondary | ICD-10-CM | POA: Diagnosis not present

## 2016-05-30 DIAGNOSIS — G8929 Other chronic pain: Secondary | ICD-10-CM | POA: Diagnosis not present

## 2016-06-04 ENCOUNTER — Ambulatory Visit (INDEPENDENT_AMBULATORY_CARE_PROVIDER_SITE_OTHER): Payer: BLUE CROSS/BLUE SHIELD | Admitting: Gynecology

## 2016-06-04 ENCOUNTER — Encounter: Payer: Self-pay | Admitting: Gynecology

## 2016-06-04 ENCOUNTER — Ambulatory Visit (INDEPENDENT_AMBULATORY_CARE_PROVIDER_SITE_OTHER): Payer: BLUE CROSS/BLUE SHIELD

## 2016-06-04 VITALS — BP 120/70

## 2016-06-04 DIAGNOSIS — D251 Intramural leiomyoma of uterus: Secondary | ICD-10-CM

## 2016-06-04 DIAGNOSIS — E782 Mixed hyperlipidemia: Secondary | ICD-10-CM | POA: Diagnosis not present

## 2016-06-04 NOTE — Patient Instructions (Signed)
Fibromas uterinos  (Uterine Fibroids)  Los fibromas uterinos son masas (tumores) de tejido que pueden desarrollarse en el vientre (útero). También se los conoce como liomiomas. Este tipo de tumor no es canceroso (benigno) y no se disemina a otras partes del cuerpo fuera de la zona pélvica, la cual se encuentra entre los huesos de la cadera. En ocasiones, los fibromas pueden crecer en las trompas de [place], en el cuello del útero o en las estructuras de soporte (ligamentos) que rodean el útero.  Una mujer puede tener uno o más fibromas. Los fibromas pueden tener diferente tamaño y peso, y crecer en distintas partes del útero. Algunos pueden crecer hasta volverse bastante grandes. La mayoría no requiere tratamiento médico.  CAUSAS  Un fibroma puede desarrollarse cuando una única célula uterina continúa creciendo (se multiplica). La mayoría de las células del cuerpo humano tienen un mecanismo de control que impide que se multipliquen sin control.  SIGNOS Y SÍNTOMAS  Entre los síntomas se pueden incluir los siguientes:  · Hemorragias intensas durante la menstruación.  · Pérdidas de sangre o hemorragias entre los períodos.  · Dolor y opresión en la pelvis.  · Problemas de la vejiga, como necesidad de orinar con más frecuencia (polaquiuria) o necesidad imperiosa de orinar.  · Incapacidad para reproducir (infertilidad).  · Abortos espontáneos.  DIAGNÓSTICO  Los fibromas uterinos se diagnostican con un examen físico. El médico puede palpar los tumores grumosos durante un examen pélvico. Pueden realizarse ecografías y una resonancia magnética para determinar el tamaño y la ubicación de los fibromas, así como la cantidad.  TRATAMIENTO  El tratamiento puede incluir lo siguiente:  · Observación cautelosa. Esto requiere que el médico controle el fibroma para saber si crece o se achica. Siga las recomendaciones del médico respecto de la frecuencia con la que debe realizarse los controles.  · Medicamentos hormonales. Pueden  tomarse por vía oral o administrarse a través de un dispositivo intrauterino (DIU).  · Cirugía.  ? Extirpación de los fibromas (miomectomía) o del útero (histerectomía).  ? Suprimir la irrigación sanguínea a los fibromas (embolización de la arteria uterina).  Si los fibromas le traen problemas de fertilidad y tiene deseos de quedar embarazada, el médico puede recomendar su extirpación.  INSTRUCCIONES PARA EL CUIDADO EN EL HOGAR  · Concurra a todas las visitas de control como se lo haya indicado el médico. Esto es importante.  · Tome los medicamentos de venta libre y los recetados solamente como se lo haya indicado el médico.  ? Si le recetaron un tratamiento hormonal, tome los medicamentos hormonales exactamente como se lo indicaron.  · Consulte al médico sobre tomar comprimidos de hierro y aumentar la cantidad de verduras de hoja color verde oscuro en la dieta. Estas medidas pueden ayudar a incrementar los niveles de hierro en la sangre, que pueden verse afectados por las hemorragias menstruales intensas.  · Preste mucha atención a la menstruación e informe al médico si hay algún cambio, por ejemplo:  ? Aumento del flujo de sangre que le exige el uso de más compresas o tampones que los que utiliza normalmente cada mes.  ? Un cambio en la cantidad de días que le dura la menstruación cada mes.  ? Un cambio en los síntomas asociados con la menstruación, como cólicos abdominales o dolor de espalda.    SOLICITE ATENCIÓN MÉDICA SI:  · Tiene dolor pélvico, dolor de espalda o cólicos abdominales que los medicamentos no pueden controlar.  · Observa un aumento del sangrado entre y   durante las menstruaciones.  · Empapa los tampones o las compresas en el término de media hora o menos tiempo.  · Se siente mareada, muy cansada o débil.    SOLICITE ATENCIÓN MÉDICA DE INMEDIATO SI:  · Se desmaya.  · El dolor pélvico aumenta repentinamente.    Esta información no tiene como fin reemplazar el consejo del médico. Asegúrese de hacerle  al médico cualquier pregunta que tenga.  Document Released: 12/30/2004 Document Revised: 04/23/2015 Document Reviewed: 06/28/2013  Elsevier Interactive Patient Education © 2017 [employer]

## 2016-06-04 NOTE — Progress Notes (Signed)
   Patient is a 46 year old was seen the office for her annual exam on May 9 and she's here for an ultrasound to follow-up with her previous scans of 2015 2017 as a result of her fibroid uterus. She was asymptomatic she is no longer taking her medication for hyperlipidemia and her recent blood work and indicated that her total cholesterol and LDL have improved now that she is exercising and eating. She would like to hold off on the medication because both Lipitor and Crestor she could not tolerate because it gave her headaches been negative feel dizzy.  Previous ultrasounds as follows: Ultrasound 2015: Ultrasound today uterus measured 10.5 x 8.3 x 6.2 cm with endometrial stripe of 13.9 mm patient is a few days and starting her menses. Patient had 4 fibroids and since one was suggestive of being submucosally a sonohysterogram was done And no intracavitary defects were noted. She did have a right corpus luteum cyst measuring 24 x 21 x 24 mm  Ultrasound 2017: Uterus measured 11.0 x 8.9 x 6.8 cm with endometrial stripe of 10.7 mm. Enlarged fibroid uterus with intramural myomas measuring 39 x 45 mm, 17 x 8 mm, 16 x 12 mm, 0.4 x 23 mm, 21 x 17 mm questionable submucous fibroid measures 16 x 13 mm was reported. Right ovary with a corpus luteum cyst collapsed measuring 22 x 20 x 29 mm with positive color flow in the periphery. Left ovary was normal. No fluid in the cul-de-sac.  Ultrasound today 2018: Uterus measured 11.6 x 9.9 x 7.1 cm endometrial stripe 6.5 mm. Patient with several fibroids the largest one 6.4 x 3.8 x 5.6 mm. Right and left ovary were normal no fluid in the cul-de-sac no adnexal masses.  Assessment/plan: Patient with fibroid uterus has slightly increased over the past year but not significantly and since patient is asymptomatic she would like to continue to follow-up with the scan 1 year. Also past history of hyperlipidemia improving with exercise and diet did not want to continue any  statins because of side effects. She'll be rescreened next year with a fasting lipid profile. Patient was complaining of right lateral knee pains from exercising and she will be referred to the orthopedic surgeon for further evaluation. Literature information on fibroids was provided in [place].

## 2016-10-22 DIAGNOSIS — M25561 Pain in right knee: Secondary | ICD-10-CM | POA: Diagnosis not present

## 2016-10-22 DIAGNOSIS — M9906 Segmental and somatic dysfunction of lower extremity: Secondary | ICD-10-CM | POA: Diagnosis not present

## 2016-11-08 DIAGNOSIS — M9906 Segmental and somatic dysfunction of lower extremity: Secondary | ICD-10-CM | POA: Diagnosis not present

## 2016-11-08 DIAGNOSIS — M25561 Pain in right knee: Secondary | ICD-10-CM | POA: Diagnosis not present

## 2016-11-12 DIAGNOSIS — M9906 Segmental and somatic dysfunction of lower extremity: Secondary | ICD-10-CM | POA: Diagnosis not present

## 2016-11-12 DIAGNOSIS — M25561 Pain in right knee: Secondary | ICD-10-CM | POA: Diagnosis not present

## 2017-05-27 ENCOUNTER — Ambulatory Visit (INDEPENDENT_AMBULATORY_CARE_PROVIDER_SITE_OTHER): Payer: BLUE CROSS/BLUE SHIELD | Admitting: Obstetrics & Gynecology

## 2017-05-27 ENCOUNTER — Encounter: Payer: Self-pay | Admitting: Obstetrics & Gynecology

## 2017-05-27 VITALS — BP 126/78 | Ht <= 58 in | Wt 156.0 lb

## 2017-05-27 DIAGNOSIS — D259 Leiomyoma of uterus, unspecified: Secondary | ICD-10-CM

## 2017-05-27 DIAGNOSIS — D219 Benign neoplasm of connective and other soft tissue, unspecified: Secondary | ICD-10-CM | POA: Diagnosis not present

## 2017-05-27 DIAGNOSIS — L989 Disorder of the skin and subcutaneous tissue, unspecified: Secondary | ICD-10-CM

## 2017-05-27 DIAGNOSIS — Z01419 Encounter for gynecological examination (general) (routine) without abnormal findings: Secondary | ICD-10-CM

## 2017-05-27 DIAGNOSIS — Z9851 Tubal ligation status: Secondary | ICD-10-CM | POA: Diagnosis not present

## 2017-05-27 NOTE — Progress Notes (Signed)
Helen Mccarthy [birth year] [id number]   History:    47 y.o. G4P4L3 married x 20 years. S/P TL.  Children 24, 10 and 36 yo.  RP:  Established patient presenting for annual gyn exam   HPI: Status post tubal ligation.  Menstruating regularly every months with normal flow.  No abdominopelvic pain associated with uterine fibroids.  The largest fibroid on pelvic ultrasound May 2018 measured 6.4 cm.  No pain with intercourse.  Complains of a lesion close to the fingernail which has been persisting for many years.  Urine and bowel movements normal.  Breasts normal.  Body mass index 32.6.  Not regularly physically active.  Will do fasting health labs here today.  Past medical history,surgical history, family history and social history were all reviewed and documented in the EPIC chart.  Gynecologic History Patient's last menstrual period was 05/26/2017. Contraception: tubal ligation Last Pap: 01/2015. Results were: Negative, HR HPV neg Last mammogram: 2018. Results were: normal per patient, done at work. Bone Density: Never Colonoscopy: Never  Obstetric History OB History  Gravida Para Term Preterm AB Living  '4 4 4     4  ' SAB TAB Ectopic Multiple Live Births          4    # Outcome Date GA Lbr Len/2nd Weight Sex Delivery Anes PTL Lv  4 Term     F CS-Unspec  N LIV  3 Term     F CS-Unspec  N LIV  2 Term     M CS-Unspec  N LIV  1 Term     M CS-Unspec  N LIV     Birth Comments: DECEASED THREE DAYS AFTER BEING BORN-      ROS: A ROS was performed and pertinent positives and negatives are included in the history.  GENERAL: No fevers or chills. HEENT: No change in vision, no earache, sore throat or sinus congestion. NECK: No pain or stiffness. CARDIOVASCULAR: No chest pain or pressure. No palpitations. PULMONARY: No shortness of breath, cough or wheeze. GASTROINTESTINAL: No abdominal pain, nausea, vomiting or diarrhea, melena or bright red blood per rectum. GENITOURINARY: No urinary  frequency, urgency, hesitancy or dysuria. MUSCULOSKELETAL: No joint or muscle pain, no back pain, no recent trauma. DERMATOLOGIC: No rash, no itching, no lesions. ENDOCRINE: No polyuria, polydipsia, no heat or cold intolerance. No recent change in weight. HEMATOLOGICAL: No anemia or easy bruising or bleeding. NEUROLOGIC: No headache, seizures, numbness, tingling or weakness. PSYCHIATRIC: No depression, no loss of interest in normal activity or change in sleep pattern.     Exam:   BP 126/78   Ht '4\' 10"'  (1.473 m)   Wt 156 lb (70.8 kg)   LMP 05/26/2017 Comment: tubal ligation   BMI 32.60 kg/m   Body mass index is 32.6 kg/m.  General appearance : Well developed well nourished female. No acute distress HEENT: Eyes: no retinal hemorrhage or exudates,  Neck supple, trachea midline, no carotid bruits, no thyroidmegaly Lungs: Clear to auscultation, no rhonchi or wheezes, or rib retractions  Heart: Regular rate and rhythm, no murmurs or gallops Breast:Examined in sitting and supine position were symmetrical in appearance, no palpable masses or tenderness,  no skin retraction, no nipple inversion, no nipple discharge, no skin discoloration, no axillary or supraclavicular lymphadenopathy Abdomen: no palpable masses or tenderness, no rebound or guarding Extremities: no edema or skin discoloration or tenderness  Pelvic: Vulva: Normal             Vagina: No  gross lesions or discharge  Cervix: No gross lesions or discharge  Uterus  AV, nodular, increased in volume, mobile, non-tender  Adnexa  Without masses or tenderness  Anus: Normal   Assessment/Plan:  47 y.o. female for annual exam   1. Well female exam with routine gynecological exam Gynecologic exam with fibroids.  Will do Pap reflex at follow-up as patient is menstruating today.  Breast exam normal.  Will schedule screening mammogram at the breast center.  Encouraged to be physically active with aerobic physical activity 5 times a week and  weightlifting every 2 days.  Fasting health labs here today. - CBC - Comp Met (CMET) - TSH - Lipid panel - VITAMIN D 25 Hydroxy (Vit-D Deficiency, Fractures)  2. S/P tubal ligation  3. Fibroids Will confirm stability of the uterine fibroids with a follow-up pelvic ultrasound.  Will rule out anemia with the CBC today. - US Transvaginal Non-OB; Future  4. Skin lesion of hand Referred to dermatology.  Persistent probable fungal infection of the skin close to the nail of the finger.  Other orders - calcium carbonate (OSCAL) 1500 (600 Ca) MG TABS tablet; Take by mouth 2 (two) times daily with a meal.  Counseling on above issues and coordination of care more than 50% for 10 minutes.  [name] MD, 2:19 PM 05/27/2017

## 2017-05-28 ENCOUNTER — Encounter: Payer: Self-pay | Admitting: Obstetrics & Gynecology

## 2017-05-28 NOTE — Patient Instructions (Signed)
1. Well female exam with routine gynecological exam Gynecologic exam with fibroids.  Will do Pap reflex at follow-up as patient is menstruating today.  Breast exam normal.  Will schedule screening mammogram at the breast center.  Encouraged to be physically active with aerobic physical activity 5 times a week and weightlifting every 2 days.  Fasting health labs here today. - CBC - Comp Met (CMET) - TSH - Lipid panel - VITAMIN D 25 Hydroxy (Vit-D Deficiency, Fractures)  2. S/P tubal ligation  3. Fibroids Will confirm stability of the uterine fibroids with a follow-up pelvic ultrasound.  Will rule out anemia with the CBC today. - US Transvaginal Non-OB; Future  4. Skin lesion of hand Referred to dermatology.  Persistent probable fungal infection of the skin close to the nail of the finger.  Other orders - calcium carbonate (OSCAL) 1500 (600 Ca) MG TABS tablet; Take by mouth 2 (two) times daily with a meal.  Spero Geralds, fue un placer encontrarle hoy!  Voy a informarle de sus [employer]!

## 2017-05-29 ENCOUNTER — Telehealth: Payer: Self-pay | Admitting: *Deleted

## 2017-05-29 DIAGNOSIS — L989 Disorder of the skin and subcutaneous tissue, unspecified: Secondary | ICD-10-CM

## 2017-05-29 NOTE — Telephone Encounter (Signed)
-----   Message from [name], MD sent at 05/27/2017  2:38 PM EDT ----- Regarding: Refer to Dermato Persistent skin lesion on finger, close to the nail.

## 2017-05-29 NOTE — Telephone Encounter (Signed)
Patient informed. Per patient request mailed her appointment and office information.

## 2017-05-29 NOTE — Telephone Encounter (Signed)
Patient scheduled on 06/25/17 @ 2:15pm with [facility] [address] will route to claudia to relay. I arranged for interpreter for this visit for patient.

## 2017-06-03 LAB — TEST AUTHORIZATION

## 2017-06-03 LAB — CBC
HEMATOCRIT: 35.7 % (ref 35.0–45.0)
Hemoglobin: 11.9 g/dL (ref 11.7–15.5)
MCH: 24.6 pg — ABNORMAL LOW (ref 27.0–33.0)
MCHC: 33.3 g/dL (ref 32.0–36.0)
MCV: 73.9 fL — AB (ref 80.0–100.0)
MPV: 10.8 fL (ref 7.5–12.5)
Platelets: 368 10*3/uL (ref 140–400)
RBC: 4.83 10*6/uL (ref 3.80–5.10)
RDW: 14.1 % (ref 11.0–15.0)
WBC: 9 10*3/uL (ref 3.8–10.8)

## 2017-06-03 LAB — COMPREHENSIVE METABOLIC PANEL
AG RATIO: 1.3 (calc) (ref 1.0–2.5)
ALBUMIN MSPROF: 4 g/dL (ref 3.6–5.1)
ALT: 18 U/L (ref 6–29)
AST: 24 U/L (ref 10–35)
Alkaline phosphatase (APISO): 77 U/L (ref 33–115)
BUN: 9 mg/dL (ref 7–25)
CHLORIDE: 101 mmol/L (ref 98–110)
CO2: 29 mmol/L (ref 20–32)
Calcium: 9.5 mg/dL (ref 8.6–10.2)
Creat: 0.61 mg/dL (ref 0.50–1.10)
GLOBULIN: 3.2 g/dL (ref 1.9–3.7)
GLUCOSE: 95 mg/dL (ref 65–99)
POTASSIUM: 3.8 mmol/L (ref 3.5–5.3)
SODIUM: 138 mmol/L (ref 135–146)
Total Bilirubin: 0.4 mg/dL (ref 0.2–1.2)
Total Protein: 7.2 g/dL (ref 6.1–8.1)

## 2017-06-03 LAB — T3, FREE: T3, Free: 2.9 pg/mL (ref 2.3–4.2)

## 2017-06-03 LAB — THYROID STIMULATING IMMUNOGLOBULIN: TSI: <89 % baseline (ref <140)

## 2017-06-03 LAB — LIPID PANEL
CHOL/HDL RATIO: 4.4 (calc) (ref <5.0)
Cholesterol: 255 mg/dL — ABNORMAL HIGH (ref <200)
HDL: 58 mg/dL (ref >50)
LDL Cholesterol (Calc): 174 mg/dL (calc) — ABNORMAL HIGH
NON-HDL CHOLESTEROL (CALC): 197 mg/dL — AB (ref <130)
Triglycerides: 105 mg/dL (ref <150)

## 2017-06-03 LAB — TSH: TSH: 5.37 m[IU]/L — AB

## 2017-06-03 LAB — VITAMIN D 25 HYDROXY (VIT D DEFICIENCY, FRACTURES): VIT D 25 HYDROXY: 46 ng/mL (ref 30–100)

## 2017-06-03 LAB — T4, FREE: FREE T4: 1 ng/dL (ref 0.8–1.8)

## 2017-06-09 ENCOUNTER — Other Ambulatory Visit: Payer: Self-pay | Admitting: Obstetrics & Gynecology

## 2017-06-09 DIAGNOSIS — R7989 Other specified abnormal findings of blood chemistry: Secondary | ICD-10-CM

## 2017-06-25 DIAGNOSIS — B079 Viral wart, unspecified: Secondary | ICD-10-CM | POA: Diagnosis not present

## 2017-06-25 DIAGNOSIS — D229 Melanocytic nevi, unspecified: Secondary | ICD-10-CM | POA: Diagnosis not present

## 2017-07-08 ENCOUNTER — Other Ambulatory Visit: Payer: Self-pay | Admitting: Obstetrics & Gynecology

## 2017-07-08 ENCOUNTER — Ambulatory Visit: Payer: BLUE CROSS/BLUE SHIELD | Admitting: Obstetrics & Gynecology

## 2017-07-08 ENCOUNTER — Ambulatory Visit (INDEPENDENT_AMBULATORY_CARE_PROVIDER_SITE_OTHER): Payer: BLUE CROSS/BLUE SHIELD

## 2017-07-08 DIAGNOSIS — R7989 Other specified abnormal findings of blood chemistry: Secondary | ICD-10-CM

## 2017-07-08 DIAGNOSIS — D250 Submucous leiomyoma of uterus: Secondary | ICD-10-CM

## 2017-07-08 DIAGNOSIS — N920 Excessive and frequent menstruation with regular cycle: Secondary | ICD-10-CM

## 2017-07-08 DIAGNOSIS — D219 Benign neoplasm of connective and other soft tissue, unspecified: Secondary | ICD-10-CM | POA: Diagnosis not present

## 2017-07-08 DIAGNOSIS — N852 Hypertrophy of uterus: Secondary | ICD-10-CM

## 2017-07-08 DIAGNOSIS — D252 Subserosal leiomyoma of uterus: Secondary | ICD-10-CM

## 2017-07-08 DIAGNOSIS — E7800 Pure hypercholesterolemia, unspecified: Secondary | ICD-10-CM | POA: Diagnosis not present

## 2017-07-08 NOTE — Progress Notes (Signed)
    Helen Mccarthy 1970/04/15 [id number]        47 y.o.  [id number] Married.  S/P TL.  RP: Menorrhagia/Uterine Fibroids for Pelvic US  HPI: Heavy menses every month.  Known Uterine Fibroids.  No pelvic pain.   OB History  Gravida Para Term Preterm AB Living  4 4 4     4   SAB TAB Ectopic Multiple Live Births          4    # Outcome Date GA Lbr Len/2nd Weight Sex Delivery Anes PTL Lv  4 Term     F CS-Unspec  N LIV  3 Term     F CS-Unspec  N LIV  2 Term     M CS-Unspec  N LIV  1 Term     M CS-Unspec  N LIV     Birth Comments: DECEASED THREE DAYS AFTER BEING BORN-     Past medical history,surgical history, problem list, medications, allergies, family history and social history were all reviewed and documented in the EPIC chart.   Directed ROS with pertinent positives and negatives documented in the history of present illness/assessment and plan.  Exam:  There were no vitals filed for this visit. General appearance:  Normal  Pelvic US today: T/V and T/a images.  Anteverted uterus measuring 11.86 x 10.15 x 10.05 cm.  Endometrial lining thickened at 17.3 mm with an endometrial mass measuring 2.1 x 1.5 x 1.6 cm.  Uterine fibroids intramural measuring 3.3 x 3.1 cm, 3.0 x 1.9 cm, 8.2 x 9.5 x 6.4 cm on the right.  Right ovary seen with transabdominal images with a thick-walled cyst measuring 2.5 x 1.8 cm.  Left ovary normal but difficult to image because of the uterus.  No free fluid in the posterior cul-de-sac.   Assessment/Plan:  47 y.o. G4P4004   1. Fibroids Uterine fibroids with a probable submucosal fibroid measuring 2.1 cm.  The other fibroids are intramural with the largest one measured at 9.5 cm.  Follow-up with a sonohysterogram to evaluate the endometrial cavity. - [place] Sonohysterogram; Future  2. Menorrhagia with regular cycle Menorrhagia associated with uterine fibroids with an intra-uterine endometrial mass measuring 2.1 cm.  The endometrial lining is thickened at  17.3 mm.  We will follow-up for a sonohysterogram to investigate further, may also do an endometrial biopsy. - [place] Sonohysterogram; Future  3. Fibroids, submucosal As described above, endometrial mass measuring 2.1 cm probably corresponding to a submucosal myoma, but will further investigate with a sonohysterogram. - [place] Sonohysterogram; Future  4. Pure hypercholesterolemia Low cholesterol diet and referral to her family physician to decide on medical treatment.  5. High serum thyroid stimulating hormone (TSH) Free T3 and free T4 were normal.  Follow-up with family physician.  Counseling on above issues and coordination of care more than 50% for 25 minutes.  [name] MD, 2:55 PM 07/08/2017

## 2017-07-12 ENCOUNTER — Encounter: Payer: Self-pay | Admitting: Obstetrics & Gynecology

## 2017-07-12 NOTE — Patient Instructions (Addendum)
1. Fibroids Uterine fibroids with a probable submucosal fibroid measuring 2.1 cm.  The other fibroids are intramural with the largest one measured at 9.5 cm.  Follow-up with a sonohysterogram to evaluate the endometrial cavity. - [place] Sonohysterogram; Future  2. Menorrhagia with regular cycle Menorrhagia associated with uterine fibroids with an intra-uterine endometrial mass measuring 2.1 cm.  The endometrial lining is thickened at 17.3 mm.  We will follow-up for a sonohysterogram to investigate further, may also do an endometrial biopsy. - [place] Sonohysterogram; Future  3. Fibroids, submucosal As described above, endometrial mass measuring 2.1 cm probably corresponding to a submucosal myoma, but will further investigate with a sonohysterogram. - [place] Sonohysterogram; Future  4. Pure hypercholesterolemia Low cholesterol diet and referral to her family physician to decide on medical treatment.  5. High serum thyroid stimulating hormone (TSH) Free T3 and free T4 were normal.  Follow-up with family physician.  Dierdre Highman un placer verla hoy!

## 2017-08-03 ENCOUNTER — Ambulatory Visit (INDEPENDENT_AMBULATORY_CARE_PROVIDER_SITE_OTHER): Payer: BLUE CROSS/BLUE SHIELD

## 2017-08-03 ENCOUNTER — Ambulatory Visit: Payer: BLUE CROSS/BLUE SHIELD | Admitting: Obstetrics & Gynecology

## 2017-08-03 DIAGNOSIS — N920 Excessive and frequent menstruation with regular cycle: Secondary | ICD-10-CM | POA: Diagnosis not present

## 2017-08-03 DIAGNOSIS — D250 Submucous leiomyoma of uterus: Secondary | ICD-10-CM

## 2017-08-03 DIAGNOSIS — D219 Benign neoplasm of connective and other soft tissue, unspecified: Secondary | ICD-10-CM | POA: Diagnosis not present

## 2017-08-03 NOTE — Progress Notes (Signed)
Helen Mccarthy 1970-04-28 [id number]        47 y.o.  [id number] status post tubal ligation  RP: Menorrhagia/Fibroids/Thickened Endometrial line for Sonohysterogram  HPI: Heavy periods every months with last menstrual period July 20, 2017.  Last hemoglobin in May 2019 was at 11.9.  Patient takes iron supplements.  Pelvic ultrasound June 2019 showed an endometrial line at 17.3 mm with a probable intra-uterine defect measuring 2.1 cm.    OB History  Gravida Para Term Preterm AB Living  4 4 4     4   SAB TAB Ectopic Multiple Live Births          4    # Outcome Date GA Lbr Len/2nd Weight Sex Delivery Anes PTL Lv  4 Term     F CS-Unspec  N LIV  3 Term     F CS-Unspec  N LIV  2 Term     M CS-Unspec  N LIV  1 Term     M CS-Unspec  N LIV     Birth Comments: DECEASED THREE DAYS AFTER BEING BORN-     Past medical history,surgical history, problem list, medications, allergies, family history and social history were all reviewed and documented in the EPIC chart.   Directed ROS with pertinent positives and negatives documented in the history of present illness/assessment and plan.  Exam:  There were no vitals filed for this visit. General appearance:  Normal                                                                    Sono Infusion Hysterogram ( procedure note)   The initial transvaginal ultrasound demonstrated the following:  T/V and T/a images.  Anteverted uterus measuring 13.71 x 12.68 x 8.74 cm.  Intramural fibroids unchanged.  Endometrial line measured at 19.0 mm with an endometrial mass measured at 2.8 x 1.7 cm.  Right ovary normal with previous cyst not seen.  Left ovary normal.  No free fluid in the posterior cul-de-sac.   The speculum  was inserted and the cervix cleansed with Betadine solution after confirming that patient has no allergies.A small sonohysterography catheterwas utilized.  Insertion was facilitated with ring forceps, using a spear-like motion the  catheter was inserted to the fundus of the uterus. The speculum is then removed carefully to avoid dislodging the catheter. The catheter was flushed with sterile saline delete prior to insertion to rid it of small amounts of air.the sterile saline solution was infused into the uterine cavity as a vaginal ultrasound probe was then placed in the vagina for full visualization of the uterine cavity from a transvaginal approach. The following was noted: Following injection of saline the endometrium is filled with a submucous defect measuring 1.9 x 2.5 x 1.9 cm seen.  Most likely a submucosal myoma with about one third of the myoma in the cavity.   The catheter was then removed after retrieving some of the saline from the intrauterine cavity. An endometrial biopsy was not done. Patient tolerated procedure well. She had received a tablet of Aleve for discomfort.    Assessment/Plan:  47 y.o. G4P4004   1. Menorrhagia with regular cycle Menorrhagia probably secondary to uterine fibroids especially the submucosal fibroids measuring 2.5 cm.  Decision to proceed with hysteroscopy excision of the submucosal fibroid.  2. Fibroids, submucosal Sonohysterogram findings reviewed with patient.  Recommend proceeding with hysteroscopy excision of the submucosal myoma.  Will schedule HSC XL Myosure Excision of SM Fibroid, D+C.  Surgery briefly discussed with patient and pamphlet given.  Follow-up preop.  Counseling on above issues and coordination of care more than 50% for 15 minutes.  [name] MD, 4:20 PM 08/03/2017

## 2017-08-04 ENCOUNTER — Encounter: Payer: Self-pay | Admitting: Obstetrics & Gynecology

## 2017-08-04 NOTE — Patient Instructions (Signed)
1. Menorrhagia with regular cycle Menorrhagia probably secondary to uterine fibroids especially the submucosal fibroids measuring 2.5 cm.  Decision to proceed with hysteroscopy excision of the submucosal fibroid.  2. Fibroids, submucosal Sonohysterogram findings reviewed with patient.  Recommend proceeding with hysteroscopy excision of the submucosal myoma.  Will schedule HSC XL Myosure Excision of SM Fibroid, D+C.  Surgery briefly discussed with patient and pamphlet given.  Follow-up preop.  Spero Geralds, fue un placer verle hoy!  Histeroscopa (Hysteroscopy) La histeroscopa es un procedimiento que se [place] para observar el interior de la matriz (tero) Puede indicarse por diferentes motivos, entre ellos:  Para evaluar una hemorragia anormal, un fibroma (tumor benigno, no canceroso), tumores, plipos o tejido cicatrizal Sport and exercise psychologist) y la posibilidad de cncer de tero.  Para detectar bultos (tumores) y otros crecimientos anormales uterinos.  Para buscar las causas por las que una mujer no queda embarazada (infertilidad) causas recurrentes de prdida de embarazo (abortos espontneos) o por la prdida de un dispositivo intrauterino (DIU).  Para realizar un procedimiento de esterilizacin cerrando las trompas de Falopio desde adentro del tero. En este procedimiento, se coloca un tubo delgado y luminoso, con una cmara en el extremo (histeroscopio)para observar el interior del tero. La histeroscopa se realiza inmediatamente despus del perodo menstrual para asegurarse de que no existe embarazo. INFORME A SU MDICO:  Cualquier alergia que tenga.  Todos los [employer] [place], incluyendo vitaminas, hierbas, gotas oftlmicas, cremas y medicamentos de venta libre.  Problemas previos que usted o los [employer] de su familia hayan tenido con el uso de anestsicos.  Enfermedades de [employer].  Cirugas previas.  Padecimientos mdicos.  RIESGOS Y COMPLICACIONES Generalmente es un  procedimiento seguro. Sin embargo, Games developer procedimiento, pueden surgir complicaciones. Las complicaciones posibles son:  Perforacin del tero.  Sangrado excesivo.  Infeccin.  Lesin en el cuello del tero.  Lesiones en otros rganos.  Reaccin alrgica a un medicamento.  Inoculacin de lquido en exceso dentro del tero. ANTES DEL PROCEDIMIENTO  Consulte a su mdico si debe cambiar o suspender los medicamentos que toma habitualmente.  No tome aspirina ni anticoagulantes durante la semana previa al procedimiento, o segn le hayan indicado. Pueden ocasionar hemorragias.  Si fuma, no lo haga durante las [employer] al procedimiento.  En algunos casos, el da anterior al procedimiento se coloca un medicamento en el cuello del tero. Este medicamento dilata el cuello y [place] la abertura. Esto facilita la insercin del instrumento en el tero durante el procedimiento.  No debe comer ni beber nada durante al menos 8 horas antes de la [place].  Pdale a alguna persona que la lleve a su casa luego del procedimiento.  PROCEDIMIENTO  Le administrarn un medicamento para relajarse (sedante). Tambin podrn administrarle uno de los siguientes medicamentos: ? Medicamentos que adormecen el rea del cuello uterino (anestesia local). ? Un medicamento para que duerma durante el procedimiento (anestesia genera).  El histeroscopio se inserta a travs de la vagina, dentro del tero. La cmara del laparoscopio enva una imagen a una pantalla de televisin que se encuentra en el quirfano. [employer] modo, el mdico tendr Ardelia Mems buena visin del interior del tero.  Durante el Wellsite geologist un lquido o aire en el interior de [place], lo que le permitir al cirujano observar mejor.  En algunas ocasiones, el tejido del interior del tero se legra suavemente. Esas muestras de tejido se envan al laboratorio para ser examinadas.  DESPUS DEL PROCEDIMIENTO  Si le han  administrado anestesia  general podr sentirse atontada durante algunas horas despus del procedimiento.  Si se utiliza Tour manager, podr volver a su casa tan pronto como est estable y se sienta en condiciones.  Podr sentir clicos leves. Es normal que esto dure un par [employer].  Podr tener una hemorragia, la que puede variar entre una pequea mancha durante algunos das hasta una hemorragia similar a Mining engineer durante 3-7 das. Esto es normal.  Si el resultado de los estudios no estn listos durante la visita, tenga otra entrevista con su mdico para conocerlos.  Esta informacin no tiene Marine scientist el consejo del mdico. Asegrese de hacerle al mdico cualquier pregunta que tenga. Document Released: 12/30/2004 Document Revised: 10/20/2012 Document Reviewed: 07/29/2012 Elsevier Interactive Patient Education  2017 [employer].

## 2017-08-11 ENCOUNTER — Encounter: Payer: Self-pay | Admitting: Anesthesiology

## 2017-08-17 ENCOUNTER — Other Ambulatory Visit: Payer: Self-pay

## 2017-08-17 ENCOUNTER — Encounter (HOSPITAL_BASED_OUTPATIENT_CLINIC_OR_DEPARTMENT_OTHER): Payer: Self-pay

## 2017-08-17 NOTE — Progress Notes (Signed)
Patient was driving when I spoke with her by phone and was unable to write down instructions.  She requested I send them to her email.   I sent instructions via email to [email or web address].  I asked patient to call back once she received and reviewed instructions.

## 2017-08-17 NOTE — Progress Notes (Signed)
Spoke with:  Helen Mccarthy NPO:  After Midnight, no gum, candy, or mints  Arrival time:  0530AM Labs:  UPT (CBC 08/26/17 in epic) AM medications:  None Pre op orders:  Yes Ride home:  Helen Mccarthy (husband) [phone]

## 2017-08-17 NOTE — Progress Notes (Signed)
Spanish interpreter requested. 

## 2017-08-26 ENCOUNTER — Encounter (HOSPITAL_COMMUNITY)
Admission: RE | Admit: 2017-08-26 | Discharge: 2017-08-26 | Disposition: A | Discharge status: Home / Self Care | Payer: BLUE CROSS/BLUE SHIELD | Source: Ambulatory Visit | Attending: Obstetrics & Gynecology | Admitting: Obstetrics & Gynecology

## 2017-08-26 ENCOUNTER — Other Ambulatory Visit: Payer: Self-pay

## 2017-08-26 DIAGNOSIS — Z01810 Encounter for preprocedural cardiovascular examination: Secondary | ICD-10-CM | POA: Insufficient documentation

## 2017-08-26 DIAGNOSIS — D649 Anemia, unspecified: Secondary | ICD-10-CM | POA: Diagnosis not present

## 2017-08-26 DIAGNOSIS — N920 Excessive and frequent menstruation with regular cycle: Secondary | ICD-10-CM | POA: Diagnosis not present

## 2017-08-26 DIAGNOSIS — Z79899 Other long term (current) drug therapy: Secondary | ICD-10-CM | POA: Diagnosis not present

## 2017-08-26 DIAGNOSIS — D250 Submucous leiomyoma of uterus: Secondary | ICD-10-CM | POA: Diagnosis not present

## 2017-08-26 DIAGNOSIS — E785 Hyperlipidemia, unspecified: Secondary | ICD-10-CM | POA: Diagnosis not present

## 2017-08-26 LAB — CBC
HCT: 35.3 % — ABNORMAL LOW (ref 36.0–46.0)
Hemoglobin: 11.8 g/dL — ABNORMAL LOW (ref 12.0–15.0)
MCH: 24.9 pg — ABNORMAL LOW (ref 26.0–34.0)
MCHC: 33.4 g/dL (ref 30.0–36.0)
MCV: 74.5 fL — AB (ref 78.0–100.0)
PLATELETS: 383 10*3/uL (ref 150–400)
RBC: 4.74 MIL/uL (ref 3.87–5.11)
RDW: 14.9 % (ref 11.5–15.5)
WBC: 10.6 10*3/uL — AB (ref 4.0–10.5)

## 2017-08-28 ENCOUNTER — Ambulatory Visit (HOSPITAL_BASED_OUTPATIENT_CLINIC_OR_DEPARTMENT_OTHER): Payer: BLUE CROSS/BLUE SHIELD | Admitting: Anesthesiology

## 2017-08-28 ENCOUNTER — Encounter (HOSPITAL_BASED_OUTPATIENT_CLINIC_OR_DEPARTMENT_OTHER): Payer: Self-pay | Admitting: *Deleted

## 2017-08-28 ENCOUNTER — Encounter (HOSPITAL_BASED_OUTPATIENT_CLINIC_OR_DEPARTMENT_OTHER)
Admission: RE | Disposition: A | Discharge status: Home / Self Care | Payer: Self-pay | Source: Ambulatory Visit | Attending: Obstetrics & Gynecology

## 2017-08-28 ENCOUNTER — Ambulatory Visit (HOSPITAL_BASED_OUTPATIENT_CLINIC_OR_DEPARTMENT_OTHER)
Admission: RE | Admit: 2017-08-28 | Discharge: 2017-08-28 | Disposition: A | Discharge status: Home / Self Care | Payer: BLUE CROSS/BLUE SHIELD | Source: Ambulatory Visit | Attending: Obstetrics & Gynecology | Admitting: Obstetrics & Gynecology

## 2017-08-28 ENCOUNTER — Other Ambulatory Visit: Payer: Self-pay

## 2017-08-28 DIAGNOSIS — Z79899 Other long term (current) drug therapy: Secondary | ICD-10-CM | POA: Insufficient documentation

## 2017-08-28 DIAGNOSIS — D649 Anemia, unspecified: Secondary | ICD-10-CM | POA: Diagnosis not present

## 2017-08-28 DIAGNOSIS — D259 Leiomyoma of uterus, unspecified: Secondary | ICD-10-CM | POA: Diagnosis not present

## 2017-08-28 DIAGNOSIS — N920 Excessive and frequent menstruation with regular cycle: Secondary | ICD-10-CM | POA: Insufficient documentation

## 2017-08-28 DIAGNOSIS — E785 Hyperlipidemia, unspecified: Secondary | ICD-10-CM | POA: Insufficient documentation

## 2017-08-28 DIAGNOSIS — D250 Submucous leiomyoma of uterus: Secondary | ICD-10-CM | POA: Diagnosis not present

## 2017-08-28 HISTORY — DX: Nausea with vomiting, unspecified: R11.2

## 2017-08-28 HISTORY — DX: Other specified abnormal findings of blood chemistry: R79.89

## 2017-08-28 HISTORY — PX: DILATATION & CURETTAGE/HYSTEROSCOPY WITH MYOSURE: SHX6511

## 2017-08-28 HISTORY — DX: Other specified postprocedural states: Z98.890

## 2017-08-28 HISTORY — DX: Excessive and frequent menstruation with regular cycle: N92.0

## 2017-08-28 HISTORY — DX: Personal history of other diseases of the female genital tract: Z87.42

## 2017-08-28 HISTORY — DX: Leiomyoma of uterus, unspecified: D25.9

## 2017-08-28 HISTORY — DX: Other specified conditions associated with female genital organs and menstrual cycle: N94.89

## 2017-08-28 HISTORY — DX: Anemia, unspecified: D64.9

## 2017-08-28 LAB — POCT PREGNANCY, URINE: PREG TEST UR: NEGATIVE

## 2017-08-28 SURGERY — DILATATION & CURETTAGE/HYSTEROSCOPY WITH MYOSURE
Anesthesia: General | Site: Uterus

## 2017-08-28 MED ORDER — LACTATED RINGERS IV SOLN
INTRAVENOUS | Status: DC
  Administered 2017-08-28 (×2): via INTRAVENOUS
  Filled 2017-08-28: qty 1000

## 2017-08-28 MED ORDER — CEFAZOLIN SODIUM-DEXTROSE 2-4 GM/100ML-% IV SOLN
INTRAVENOUS | Status: AC
  Filled 2017-08-28: qty 100

## 2017-08-28 MED ORDER — MIDAZOLAM HCL 2 MG/2ML IJ SOLN
INTRAMUSCULAR | Status: DC | PRN
Start: 2017-08-28 — End: 2017-08-28
  Administered 2017-08-28: 2 mg via INTRAVENOUS

## 2017-08-28 MED ORDER — LIDOCAINE HCL 1 % IJ SOLN
INTRAMUSCULAR | Status: DC | PRN
  Administered 2017-08-28: 20 mL

## 2017-08-28 MED ORDER — SODIUM CHLORIDE 0.9 % IR SOLN
Status: DC | PRN
  Administered 2017-08-28: 1

## 2017-08-28 MED ORDER — KETOROLAC TROMETHAMINE 30 MG/ML IJ SOLN
INTRAMUSCULAR | Status: AC
  Filled 2017-08-28: qty 1

## 2017-08-28 MED ORDER — WHITE PETROLATUM EX OINT
TOPICAL_OINTMENT | CUTANEOUS | Status: AC
  Filled 2017-08-28: qty 10

## 2017-08-28 MED ORDER — PROPOFOL 10 MG/ML IV BOLUS
INTRAVENOUS | Status: DC | PRN
  Administered 2017-08-28: 150 mg via INTRAVENOUS

## 2017-08-28 MED ORDER — LIDOCAINE 2% (20 MG/ML) 5 ML SYRINGE
INTRAMUSCULAR | Status: AC
  Filled 2017-08-28: qty 5

## 2017-08-28 MED ORDER — ONDANSETRON HCL 4 MG/2ML IJ SOLN
4.0000 mg | Freq: Once | INTRAMUSCULAR | Status: AC | PRN
  Administered 2017-08-28: 4 mg via INTRAVENOUS
  Filled 2017-08-28: qty 2

## 2017-08-28 MED ORDER — FENTANYL CITRATE (PF) 100 MCG/2ML IJ SOLN
INTRAMUSCULAR | Status: DC | PRN
  Administered 2017-08-28 (×2): 25 ug via INTRAVENOUS
  Administered 2017-08-28: 50 ug via INTRAVENOUS

## 2017-08-28 MED ORDER — KETOROLAC TROMETHAMINE 30 MG/ML IJ SOLN
INTRAMUSCULAR | Status: DC | PRN
  Administered 2017-08-28: 30 mg via INTRAVENOUS

## 2017-08-28 MED ORDER — PROPOFOL 10 MG/ML IV BOLUS
INTRAVENOUS | Status: AC
  Filled 2017-08-28: qty 40

## 2017-08-28 MED ORDER — FENTANYL CITRATE (PF) 100 MCG/2ML IJ SOLN
INTRAMUSCULAR | Status: AC
  Filled 2017-08-28: qty 2

## 2017-08-28 MED ORDER — MIDAZOLAM HCL 2 MG/2ML IJ SOLN
INTRAMUSCULAR | Status: AC
Start: 2017-08-28 — End: ?
  Filled 2017-08-28: qty 2

## 2017-08-28 MED ORDER — ONDANSETRON HCL 4 MG/2ML IJ SOLN
INTRAMUSCULAR | Status: AC
  Filled 2017-08-28: qty 2

## 2017-08-28 MED ORDER — LIDOCAINE 2% (20 MG/ML) 5 ML SYRINGE
INTRAMUSCULAR | Status: DC | PRN
  Administered 2017-08-28: 100 mg via INTRAVENOUS

## 2017-08-28 MED ORDER — MEPERIDINE HCL 25 MG/ML IJ SOLN
6.2500 mg | INTRAMUSCULAR | Status: DC | PRN
  Filled 2017-08-28: qty 1

## 2017-08-28 MED ORDER — DEXAMETHASONE SODIUM PHOSPHATE 10 MG/ML IJ SOLN
INTRAMUSCULAR | Status: AC
  Filled 2017-08-28: qty 1

## 2017-08-28 MED ORDER — DEXAMETHASONE SODIUM PHOSPHATE 10 MG/ML IJ SOLN
INTRAMUSCULAR | Status: DC | PRN
  Administered 2017-08-28: 10 mg via INTRAVENOUS

## 2017-08-28 MED ORDER — HYDROMORPHONE HCL 1 MG/ML IJ SOLN
0.2500 mg | INTRAMUSCULAR | Status: DC | PRN
  Filled 2017-08-28: qty 0.5

## 2017-08-28 MED ORDER — CEFAZOLIN SODIUM-DEXTROSE 2-4 GM/100ML-% IV SOLN
2.0000 g | INTRAVENOUS | Status: AC
  Administered 2017-08-28: 2 g via INTRAVENOUS
  Filled 2017-08-28: qty 100

## 2017-08-28 MED ORDER — ONDANSETRON HCL 4 MG/2ML IJ SOLN
INTRAMUSCULAR | Status: DC | PRN
  Administered 2017-08-28: 4 mg via INTRAVENOUS

## 2017-08-28 SURGICAL SUPPLY — 23 items
CANISTER SUCT 3000ML PPV (MISCELLANEOUS) ×2 IMPLANT
CATH ROBINSON RED A/P 16FR (CATHETERS) ×2 IMPLANT
COUNTER NEEDLE 1200 MAGNETIC (NEEDLE) ×2 IMPLANT
DEVICE MYOSURE LITE (MISCELLANEOUS) ×2 IMPLANT
DEVICE MYOSURE REACH (MISCELLANEOUS) IMPLANT
DILATOR CANAL MILEX (MISCELLANEOUS) IMPLANT
ELECT REM PT RETURN 9FT ADLT (ELECTROSURGICAL)
ELECTRODE REM PT RTRN 9FT ADLT (ELECTROSURGICAL) IMPLANT
FILTER ARTHROSCOPY CONVERTOR (FILTER) ×2 IMPLANT
GLOVE BIO SURGEON STRL SZ 6.5 (GLOVE) ×2 IMPLANT
GLOVE BIOGEL PI IND STRL 7.0 (GLOVE) ×2 IMPLANT
GLOVE BIOGEL PI INDICATOR 7.0 (GLOVE) ×2
GOWN STRL REUS W/TWL LRG LVL3 (GOWN DISPOSABLE) ×4 IMPLANT
IV NS IRRIG 3000ML ARTHROMATIC (IV SOLUTION) ×4 IMPLANT
MYOSURE XL FIBROID REM (MISCELLANEOUS)
PACK VAGINAL MINOR WOMEN LF (CUSTOM PROCEDURE TRAY) ×2 IMPLANT
PAD OB MATERNITY 4.3X12.25 (PERSONAL CARE ITEMS) ×2 IMPLANT
PAD PREP 24X48 CUFFED NSTRL (MISCELLANEOUS) ×2 IMPLANT
SEAL ROD LENS SCOPE MYOSURE (ABLATOR) ×2 IMPLANT
SYSTEM TISS REMOVAL MYSR XL RM (MISCELLANEOUS) IMPLANT
TOWEL OR 17X24 6PK STRL BLUE (TOWEL DISPOSABLE) ×4 IMPLANT
TUBING AQUILEX INFLOW (TUBING) ×2 IMPLANT
TUBING AQUILEX OUTFLOW (TUBING) ×2 IMPLANT

## 2017-08-28 NOTE — Op Note (Addendum)
Operative Note  08/28/2017  8:25 AM  PATIENT:  Helen Mccarthy  47 y.o. female  PRE-OPERATIVE DIAGNOSIS:  Submucosal myoma with menorrhagia  POST-OPERATIVE DIAGNOSIS:  Submucosal myoma with menorrhagia  PROCEDURE:  Procedure(s): HYSTEROSCOPY WITH MYOSURE EXCISION OF SUBMUCOSAL MYOMA AND DILATATION & CURETTAGE  SURGEON:  Surgeon(s): [name], MD  ANESTHESIA:   general with laryngeal mask  FINDINGS: Right posterior submucosal myoma.  Both ostia well seen, no other pathology.  DESCRIPTION OF OPERATION: Under general anesthesia with laryngeal mask, the patient is in lithotomy position.  She is prepped with Betadine on the suprapubic, vulvar and vaginal areas.  The bladder is catheterized.  The patient draped is as usual.  Timeout is done.  The vaginal exam reveals an anteverted uterus normal volume, mobile, no adnexal mass.  The speculum is inserted in the vagina and the anterior lip of the cervix is grasped with a tenaculum.  A paracervical block is done with lidocaine 1% a total of 20 cc at 4 and 8:00.  Dilation of the cervix with Pratt dilators up to number 25.  Insertion of the hysteroscope in the intrauterine cavity.  Both ostia are well seen, pictures are taken.  A  2 to 2.5 cm submucosal myoma is present at the right posterior wall of the intrauterine cavity.  We used the MyoSure instrument to excise it.  Pictures are taken before and after excision.  Hemostasis is adequate.  The hysteroscope with MyoSure are removed.  We proceeded with a systematic curettage of the intrauterine cavity with a sharp curette.  Both specimens are sent to pathology together.  The tenaculum was removed from the cervix. Good hemostasis.  The speculum was removed.  The patient is brought to recovery room in good and stable status.  ESTIMATED BLOOD LOSS: 10 mL FLUID DEFICIT: 1 L  Intake/Output Summary (Last 24 hours) at 08/28/2017 0825 Last data filed at 08/28/2017 0816 Gross per 24 hour   Intake 200 ml  Output 60 ml  Net 140 ml     BLOOD ADMINISTERED:none   LOCAL MEDICATIONS USED:  LIDOCAINE 1% 20 cc Paracervical block  SPECIMEN:  Source of Specimen:  Resection material and endometrial curettings  DISPOSITION OF SPECIMEN:  PATHOLOGY  COUNTS:  YES  PLAN OF CARE: Transfer to PACU  [name] LavoieMD8:25 AM

## 2017-08-28 NOTE — Anesthesia Procedure Notes (Signed)
Procedure Name: LMA Insertion Date/Time: 08/28/2017 7:36 AM Performed by: Wanita Chamberlain, CRNA Pre-anesthesia Checklist: Patient identified, Timeout performed, Emergency Drugs available, Suction available and Patient being monitored Patient Re-evaluated:Patient Re-evaluated prior to induction Oxygen Delivery Method: Circle system utilized Preoxygenation: Pre-oxygenation with 100% oxygen Induction Type: IV induction Ventilation: Mask ventilation without difficulty LMA: LMA inserted LMA Size: 4.0 Number of attempts: 1 Placement Confirmation: breath sounds checked- equal and bilateral,  CO2 detector and positive ETCO2 Tube secured with: Tape Dental Injury: Teeth and Oropharynx as per pre-operative assessment

## 2017-08-28 NOTE — Anesthesia Preprocedure Evaluation (Addendum)
Anesthesia Evaluation  Patient identified by MRN, date of birth, ID band Patient awake    Reviewed: Allergy & Precautions, NPO status , Patient's Chart, lab work & pertinent test results  History of Anesthesia Complications (+) PONV  Airway Mallampati: I  TM Distance: >3 FB Neck ROM: Full    Dental  (+) Teeth Intact, Dental Advisory Given   Pulmonary neg pulmonary ROS,    Pulmonary exam normal        Cardiovascular negative cardio ROS Normal cardiovascular exam Rhythm:Regular Rate:Normal     Neuro/Psych negative neurological ROS  negative psych ROS   GI/Hepatic negative GI ROS, Neg liver ROS,   Endo/Other  negative endocrine ROS  Renal/GU negative Renal ROS  negative genitourinary   Musculoskeletal negative musculoskeletal ROS (+)   Abdominal   Peds  Hematology  (+) anemia ,   Anesthesia Other Findings   Reproductive/Obstetrics Fibroid                           Anesthesia Physical Anesthesia Plan  ASA: II  Anesthesia Plan: General   Post-op Pain Management:    Induction: Intravenous  PONV Risk Score and Plan: 3 and Ondansetron, Midazolam and Scopolamine patch - Pre-op  Airway Management Planned: LMA  Additional Equipment:   Intra-op Plan:   Post-operative Plan: Extubation in OR  Informed Consent: I have reviewed the patients History and Physical, chart, labs and discussed the procedure including the risks, benefits and alternatives for the proposed anesthesia with the patient or authorized representative who has indicated his/her understanding and acceptance.     Plan Discussed with: CRNA and Surgeon  Anesthesia Plan Comments:         Anesthesia Quick Evaluation

## 2017-08-28 NOTE — Transfer of Care (Signed)
Immediate Anesthesia Transfer of Care Note  Patient: Helen Mccarthy  Procedure(s) Performed: DILATATION & CURETTAGE/HYSTEROSCOPY WITH XL MYOSURE (N/A Uterus)  Patient Location: PACU  Anesthesia Type:General  Level of Consciousness: awake, alert , oriented and patient cooperative  Airway & Oxygen Therapy: Patient Spontanous Breathing and Patient connected to nasal cannula oxygen  Post-op Assessment: Report given to RN and Post -op Vital signs reviewed and stable  Post vital signs: Reviewed and stable  Last Vitals:  Vitals Value Taken Time  BP 146/111 08/28/2017  8:35 AM  Temp    Pulse 72 08/28/2017  8:38 AM  Resp 13 08/28/2017  8:38 AM  SpO2 100 % 08/28/2017  8:38 AM  Vitals shown include unvalidated device data.  Last Pain:  Vitals:   08/28/17 0528  TempSrc: Oral         Complications: No apparent anesthesia complications

## 2017-08-28 NOTE — H&P (Signed)
Nyrie Avila-Bracamontes is an 47 y.o. female. [id number] status post tubal ligation  RP: Menorrhagia/Fibroids/SM Myoma for [facility] Excision/D+C  HPI: Heavy periods every months with last menstrual period July 20, 2017.  Last hemoglobin in May 2019 was at 11.9.  Patient takes iron supplements.  Pelvic ultrasound June 2019 showed an endometrial line at 17.3 mm with a probable intra-uterine defect measuring 2.1 cm, confirmed by Sonohysterogram at 2.5 cm.   Pertinent Gynecological History: Menses: flow is excessive with use of many pads or tampons on heaviest days Contraception: tubal ligation Blood transfusions: none Sexually transmitted diseases: H/O PID Last mammogram: normal  Last pap: normal   Menstrual History: Patient's last menstrual period was 07/13/2017 (exact date).    Past Medical History:  Diagnosis Date  . Anemia   . Elevated TSH   . Endometrial mass   . History of PID   . Hyperlipidemia   . Menorrhagia   . PONV (postoperative nausea and vomiting)   . Uterine fibroid     Past Surgical History:  Procedure Laterality Date  . CESAREAN SECTION     X4  . KNEE SURGERY Right   . TUBAL LIGATION      Family History  Problem Relation Age of Onset  . Hypertension Mother   . Hypertension Father   . Diabetes Paternal Aunt   . Diabetes Paternal Uncle     Social History:  reports that she has never smoked. She has never used smokeless tobacco. She reports that she drinks alcohol. She reports that she does not use drugs.  Allergies: No Known Allergies  Medications Prior to Admission  Medication Sig Dispense Refill Last Dose  . calcium carbonate (OSCAL) 1500 (600 Ca) MG TABS tablet Take by mouth 2 (two) times daily with a meal.   Past Week at Unknown time  . ferrous sulfate 325 (65 FE) MG tablet Take 325 mg by mouth daily with breakfast.   Past Week at Unknown time  . Multiple Vitamin (MULTIVITAMIN) tablet Take 1 tablet by mouth daily.   Past Week at Unknown time     REVIEW OF SYSTEMS: A ROS was performed and pertinent positives and negatives are included in the history.  GENERAL: No fevers or chills. HEENT: No change in vision, no earache, sore throat or sinus congestion. NECK: No pain or stiffness. CARDIOVASCULAR: No chest pain or pressure. No palpitations. PULMONARY: No shortness of breath, cough or wheeze. GASTROINTESTINAL: No abdominal pain, nausea, vomiting or diarrhea, melena or bright red blood per rectum. GENITOURINARY: No urinary frequency, urgency, hesitancy or dysuria. MUSCULOSKELETAL: No joint or muscle pain, no back pain, no recent trauma. DERMATOLOGIC: No rash, no itching, no lesions. ENDOCRINE: No polyuria, polydipsia, no heat or cold intolerance. No recent change in weight. HEMATOLOGICAL: No anemia or easy bruising or bleeding. NEUROLOGIC: No headache, seizures, numbness, tingling or weakness. PSYCHIATRIC: No depression, no loss of interest in normal activity or change in sleep pattern.     Blood pressure (!) 142/74, pulse 60, temperature 98.9 F (37.2 C), temperature source Oral, resp. rate 16, height 4\' 11"  (1.499 m), weight 71.3 kg, last menstrual period 07/13/2017, SpO2 100 %.  Physical Exam:  See office notes   Results for orders placed or performed during the hospital encounter of 08/28/17 (from the past 24 hour(s))  Pregnancy, urine POC     Status: None   Collection Time: 08/28/17  6:50 AM  Result Value Ref Range   Preg Test, Ur NEGATIVE NEGATIVE     [facility]  Hysterogram ( procedure note) on 08/03/2017   The initial transvaginal ultrasound demonstrated the following:  T/V and T/a images.  Anteverted uterus measuring 13.71 x 12.68 x 8.74 cm.  Intramural fibroids unchanged.  Endometrial line measured at 19.0 mm with an endometrial mass measured at 2.8 x 1.7 cm.  Right ovary normal with previous cyst not seen.  Left ovary normal.  No free fluid in the posterior cul-de-sac.   The speculum  was inserted and the cervix  cleansed with Betadine solution after confirming that patient has no allergies.A small sonohysterography catheterwas utilized.  Insertion was facilitated with ring forceps, using a spear-like motion the catheter was inserted to the fundus of the uterus. The speculum is then removed carefully to avoid dislodging the catheter. The catheter was flushed with sterile saline delete prior to insertion to rid it of small amounts of air.the sterile saline solution was infused into the uterine cavity as a vaginal ultrasound probe was then placed in the vagina for full visualization of the uterine cavity from a transvaginal approach. The following was noted: Following injection of saline the endometrium is filled with a submucous defect measuring 1.9 x 2.5 x 1.9 cm seen.  Most likely a submucosal myoma with about one third of the myoma in the cavity.   The catheter was then removed after retrieving some of the saline from the intrauterine cavity. An endometrial biopsy was not done. Patient tolerated procedure well. She had received a tablet of Aleve for discomfort.    Assessment/Plan:  47 y.o. G4P4004   1. Menorrhagia with regular cycle Menorrhagia probably secondary to uterine fibroids especially the submucosal fibroids measuring 2.5 cm.  Decision to proceed with hysteroscopy excision of the submucosal fibroid.  2. Fibroids, submucosal Sonohysterogram findings reviewed with patient.  Recommend proceeding with hysteroscopy excision of the submucosal myoma.  Will schedule HSC XL Myosure Excision of SM Fibroid, D+C.  Surgery discussed with patient including risks as below.                        Patient was counseled as to the risk of surgery to include the following:  1. Infection (prohylactic antibiotics will be administered)  2. DVT/Pulmonary Embolism (prophylactic pneumo compression stockings will be used)  3.Trauma to internal organs requiring additional surgical procedure to repair any injury  to internal organs requiring perhaps additional hospitalization days.  4.Hemmorhage requiring transfusion and blood products which carry risks such as  anaphylactic reaction, hepatitis and AIDS  Patient had received literature information on the procedure scheduled and all her questions were answered and fully accepts all risk.   [name] Quentin Strebel 08/28/2017, 7:25 AM

## 2017-08-28 NOTE — Anesthesia Postprocedure Evaluation (Signed)
Anesthesia Post Note  Patient: Helen Mccarthy  Procedure(s) Performed: DILATATION & CURETTAGE/HYSTEROSCOPY WITH XL MYOSURE (N/A Uterus)     Patient location during evaluation: PACU Anesthesia Type: General Level of consciousness: awake and alert Pain management: pain level controlled Vital Signs Assessment: post-procedure vital signs reviewed and stable Respiratory status: spontaneous breathing, nonlabored ventilation, respiratory function stable and patient connected to nasal cannula oxygen Cardiovascular status: blood pressure returned to baseline and stable Postop Assessment: no apparent nausea or vomiting Anesthetic complications: no    Last Vitals:  Vitals:   08/28/17 0900 08/28/17 0930  BP: (!) 145/77 132/72  Pulse: (!) 58 (!) 58  Resp: 13 17  Temp:    SpO2: 100% 100%    Last Pain:  Vitals:   08/28/17 0945  TempSrc:   PainSc: 5                  Sheralee Qazi DAVID

## 2017-08-28 NOTE — Discharge Instructions (Addendum)
°  NO ADVIL, ALEVE, MOTRIN, IBUPROFEN UNTIL 230 PM TODAY    Histeroscopa - Cuidados posteriores (Hysteroscopy, Care After) [place] prximas semanas. Estas indicaciones le proporcionan informacin general acerca de cmo deber cuidarse despus del procedimiento. El mdico tambin podr darle instrucciones ms especficas. El tratamiento se ha planificado de acuerdo a las prcticas mdicas actuales, pero a veces se producen problemas. Comunquese con el mdico si tiene algn problema o tiene dudas despus del procedimiento. QU ESPERAR DESPUS DEL PROCEDIMIENTO Despus del procedimiento, es tpico tener las siguientes sensaciones:  Podr sentir clicos leves. Es normal que esto dure un par [employer].  Aumenta el sangrado. Puede variar desde un sangrado ligero durante un par de [employer] un sangrado similar al menstrual por 3 - 7 das. [place] durante los primeros 1-2 das despus del procedimiento.  Tome slo medicamentos de venta libre o recetados, segn las indicaciones del mdico. No tome aspirina. La aspirina puede aumentar el riesgo de sangrado.  Tome slo duchas y no baos durante 2 semanas, segn las indicaciones de su mdico.  No conduzca por 24 horas o siga las indicaciones de su mdico.  No beba alcohol si toma analgsicos.  No utilice tampones, duchas vaginales ni tenga relaciones sexuales durante 2 semanas o hasta que el profesional la autorice.  Tmese la [employer] por da, durante 4  5 das. Antela cada vez.  Siga las indicaciones de su mdico acerca de la dieta, la actividad fsica y levantar objetos pesados.  Si est constipada, usted puede: ? Tomar un laxante suave si su mdico la autoriza. ? Agregar salvado a su dieta. ? Beba suficiente lquido para mantener la orina clara o de color amarillo plido.  Pdale a alguna persona que permanezca con usted durante las primeras 24 a 48  horas, especialmente si le han administrado anestesia general.  Concurra a las consultas de control con su mdico segn las indicaciones.  SOLICITE ATENCIN MDICA SI:  Se siente mareado o sufre un desmayo.  Tiene Higher education careers adviser (nuseas).  Tiene flujo vaginal anormal.  Tiene una erupcin.  Aumenta el dolor y no puede controlarlo con Conservation officer, nature.  SOLICITE ATENCIN MDICA DE INMEDIATO SI:  Tiene cogulos de sangre o una hemorragia ms abundante que un perodo menstrual normal.  Tiene fiebre.  Aumenta el dolor y no puede controlarlo con Conservation officer, nature.  Siente un dolor nuevo en el vientre (abdominal).  Se desmaya.  Tiene dolor en los hombros (en la zona donde van los breteles).  Le falta el aire.  Esta informacin no tiene Marine scientist el consejo del mdico. Asegrese de hacerle al mdico cualquier pregunta que tenga. Document Released: 10/20/2012 Document Revised: 10/20/2012 Document Reviewed: 07/29/2012 Elsevier Interactive Patient Education  2017 [employer].

## 2017-08-28 NOTE — OR Nursing (Signed)
Interpreter used per protocol,

## 2017-08-31 ENCOUNTER — Encounter (HOSPITAL_BASED_OUTPATIENT_CLINIC_OR_DEPARTMENT_OTHER): Payer: Self-pay | Admitting: Obstetrics & Gynecology

## 2017-09-17 ENCOUNTER — Other Ambulatory Visit: Payer: Self-pay | Admitting: Family Medicine

## 2017-09-17 DIAGNOSIS — R109 Unspecified abdominal pain: Secondary | ICD-10-CM

## 2017-09-18 ENCOUNTER — Ambulatory Visit
Admission: RE | Admit: 2017-09-18 | Discharge: 2017-09-18 | Disposition: A | Discharge status: Home / Self Care | Payer: BLUE CROSS/BLUE SHIELD | Source: Ambulatory Visit | Attending: Family Medicine | Admitting: Family Medicine

## 2017-09-18 DIAGNOSIS — R109 Unspecified abdominal pain: Secondary | ICD-10-CM

## 2017-09-18 DIAGNOSIS — N852 Hypertrophy of uterus: Secondary | ICD-10-CM | POA: Diagnosis not present

## 2017-09-28 ENCOUNTER — Ambulatory Visit: Payer: BLUE CROSS/BLUE SHIELD | Admitting: Obstetrics & Gynecology

## 2017-10-07 ENCOUNTER — Encounter: Payer: Self-pay | Admitting: Obstetrics & Gynecology

## 2017-10-07 ENCOUNTER — Ambulatory Visit: Payer: BLUE CROSS/BLUE SHIELD | Admitting: Obstetrics & Gynecology

## 2017-10-07 VITALS — BP 124/84

## 2017-10-07 DIAGNOSIS — N920 Excessive and frequent menstruation with regular cycle: Secondary | ICD-10-CM

## 2017-10-07 DIAGNOSIS — Z09 Encounter for follow-up examination after completed treatment for conditions other than malignant neoplasm: Secondary | ICD-10-CM | POA: Diagnosis not present

## 2017-10-07 DIAGNOSIS — D219 Benign neoplasm of connective and other soft tissue, unspecified: Secondary | ICD-10-CM | POA: Diagnosis not present

## 2017-10-07 MED ORDER — NORETHINDRONE 0.35 MG PO TABS: 1.0000 | ORAL_TABLET | Freq: Every day | ORAL | 4 refills | Status: DC

## 2017-10-07 NOTE — Progress Notes (Signed)
    Helen Mccarthy 1970-01-25 [id number]        47 y.o.  [id number]   RP: Post op [place] Myosure excision of SM Myoma and D+C on 08/28/2017  HPI: Very good postop evolution with no pelvic pain.  Did have a still heavy menstrual period September 20th.  No abnormal vaginal discharge.  No fever.   OB History  Gravida Para Term Preterm AB Living  4 4 4     4   SAB TAB Ectopic Multiple Live Births          4    # Outcome Date GA Lbr Len/2nd Weight Sex Delivery Anes PTL Lv  4 Term     F CS-Unspec  N LIV  3 Term     F CS-Unspec  N LIV  2 Term     M CS-Unspec  N LIV  1 Term     M CS-Unspec  N LIV     Birth Comments: DECEASED THREE DAYS AFTER BEING BORN-     Past medical history,surgical history, problem list, medications, allergies, family history and social history were all reviewed and documented in the EPIC chart.   Directed ROS with pertinent positives and negatives documented in the history of present illness/assessment and plan.  Exam:  Vitals:   10/07/17 1244  BP: 124/84   General appearance:  Normal  Abdomen: Normal  Gynecologic exam: Vulva normal.  Bimanual exam: Uterus anteverted, nontender, mobile.  No adnexal mass, nontender bilaterally.  Pathology: Benign endometrium and fibroid.  Assessment/Plan:  47 y.o. G4P4004   1. Status post gynecological surgery, follow-up exam Very good postop evolution with no complication.  Pathology benign, reviewed with patient.  2. Fibroids Submucosal myoma excised under hysteroscopy.  3. Menorrhagia with regular cycle Menstrual periods still heavy post excision of submucosal myoma.  Patient is status post tubal ligation.  Decision to start on a progestin pill.  Usage reviewed with patient and prescription sent to pharmacy.  Other orders - norethindrone (MICRONOR,CAMILA,ERRIN) 0.35 MG tablet; Take 1 tablet (0.35 mg total) by mouth daily.  Counseling on above issues and coordination of care more than 50% for 15  minutes.  [name] MD, 12:54 PM 10/07/2017

## 2017-10-07 NOTE — Patient Instructions (Signed)
1. Status post gynecological surgery, follow-up exam Very good postop evolution with no complication.  Pathology benign, reviewed with patient.  2. Fibroids Submucosal myoma excised under hysteroscopy.  3. Menorrhagia with regular cycle Menstrual periods still heavy post excision of submucosal myoma.  Patient is status post tubal ligation.  Decision to start on a progestin pill.  Usage reviewed with patient and prescription sent to pharmacy.  Other orders - norethindrone (MICRONOR,CAMILA,ERRIN) 0.35 MG tablet; Take 1 tablet (0.35 mg total) by mouth daily.  Spero Geralds, fue un placer verle hoy!

## 2017-11-06 ENCOUNTER — Other Ambulatory Visit: Payer: Self-pay | Admitting: Anesthesiology

## 2017-12-16 ENCOUNTER — Encounter: Payer: Self-pay | Admitting: Obstetrics & Gynecology

## 2017-12-16 ENCOUNTER — Ambulatory Visit: Payer: BLUE CROSS/BLUE SHIELD | Admitting: Obstetrics & Gynecology

## 2017-12-16 VITALS — BP 128/84

## 2017-12-16 DIAGNOSIS — D219 Benign neoplasm of connective and other soft tissue, unspecified: Secondary | ICD-10-CM

## 2017-12-16 DIAGNOSIS — N920 Excessive and frequent menstruation with regular cycle: Secondary | ICD-10-CM

## 2017-12-16 MED ORDER — NORETHINDRONE 0.35 MG PO TABS: 1.0000 | ORAL_TABLET | Freq: Every day | ORAL | 4 refills | Status: DC

## 2017-12-16 NOTE — Patient Instructions (Signed)
1. 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  Decision to start on the progestin only pill.  Usage reviewed.  Prescription sent to new pharmacy.  2. Fibroids Discussion of uterine fibroids and management if the progestin only pill does not help.  Patient will consider hysterectomy and that situation.  Other orders - norethindrone (MICRONOR,CAMILA,ERRIN) 0.35 MG tablet; Take 1 tablet (0.35 mg total) by mouth daily.  Spero Geralds, fue un placer verle hoy!

## 2017-12-16 NOTE — Progress Notes (Signed)
    Helen Mccarthy May 19, 1970 [id number]        47 y.o.  [id number]  Married.  S/P TL.  RP: Continued heavy menses post Excision of SM Myoma by [facility] on August 28, 2017  HPI: Didn't start on the Progestin-only pill because she was told by her pharmacist that they didn't have it, she now has changed pharmacy.  Menses have been regular but continued to be heavy.  No breakthrough bleeding.  No pelvic pain.   OB History  Gravida Para Term Preterm AB Living  4 4 4     4   SAB TAB Ectopic Multiple Live Births          4    # Outcome Date GA Lbr Len/2nd Weight Sex Delivery Anes PTL Lv  4 Term     F CS-Unspec  N LIV  3 Term     F CS-Unspec  N LIV  2 Term     M CS-Unspec  N LIV  1 Term     M CS-Unspec  N LIV     Birth Comments: DECEASED THREE DAYS AFTER BEING BORN-     Past medical history,surgical history, problem list, medications, allergies, family history and social history were all reviewed and documented in the EPIC chart.   Directed ROS with pertinent positives and negatives documented in the history of present illness/assessment and plan.  Exam:  Vitals:   12/16/17 1523  BP: 128/84   General appearance:  Normal  Abdomen: Normal.  Gynecologic exam: Vulva normal.  Uterus AV, about 10 cm, mobile, NT.  Mild menstrual blood.  No adnexal mass, NT.   Assessment/Plan:  46 y.o. G4P4004   1. 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  Decision to start on the progestin only pill.  Usage reviewed.  Prescription sent to new pharmacy.  2. Fibroids Discussion of uterine fibroids and management if the progestin only pill does not help.  Patient will consider hysterectomy and that situation.  Other orders - norethindrone (MICRONOR,CAMILA,ERRIN) 0.35 MG tablet; Take 1 tablet (0.35 mg total) by mouth daily.  Counseling on above issues and coordination of care more than 50% for 15 minutes.  [name] MD, 3:38  PM 12/16/2017

## 2018-06-02 ENCOUNTER — Encounter: Payer: BLUE CROSS/BLUE SHIELD | Admitting: Obstetrics & Gynecology

## 2018-07-06 ENCOUNTER — Other Ambulatory Visit: Payer: Self-pay

## 2018-07-07 ENCOUNTER — Encounter: Payer: Self-pay | Admitting: Obstetrics & Gynecology

## 2018-07-07 ENCOUNTER — Ambulatory Visit: Payer: BC Managed Care – PPO | Admitting: Obstetrics & Gynecology

## 2018-07-07 VITALS — BP 130/82 | Ht <= 58 in | Wt 150.0 lb

## 2018-07-07 DIAGNOSIS — D219 Benign neoplasm of connective and other soft tissue, unspecified: Secondary | ICD-10-CM | POA: Diagnosis not present

## 2018-07-07 DIAGNOSIS — Z9851 Tubal ligation status: Secondary | ICD-10-CM

## 2018-07-07 DIAGNOSIS — E6609 Other obesity due to excess calories: Secondary | ICD-10-CM | POA: Diagnosis not present

## 2018-07-07 DIAGNOSIS — Z01419 Encounter for gynecological examination (general) (routine) without abnormal findings: Secondary | ICD-10-CM

## 2018-07-07 DIAGNOSIS — Z6831 Body mass index (BMI) 31.0-31.9, adult: Secondary | ICD-10-CM

## 2018-07-07 NOTE — Progress Notes (Signed)
Helen Mccarthy 11/19/1970 [id number]   History:    48 y.o. G4P4L4 Married. S/P TL.  RP:  Established patient presenting for annual gyn exam   HPI: Stopped the Progestin-only BCPs, since then, menses are regular every month with normal flow.  No BTB.  No pelvic pain.  Known Uterine Fibroids, had a [place] Myosure Excision of SM Myoma/D+C on 08/28/2017.  No pain with IC. Urine/BMs normal.  Breasts normal.  BMI 31.35.  Enjoys Zumba.  [employer] at work.  Past medical history,surgical history, family history and social history were all reviewed and documented in the EPIC chart.  Gynecologic History Patient's last menstrual period was 07/07/2018. Contraception: S/P Tubal Ligation Last Pap: 01/2015. Results were: Negative/HPV HR negative Last mammogram: 04/2015. Results were: Negative Bone Density: Never Colonoscopy: Never  Obstetric History OB History  Gravida Para Term Preterm AB Living  4 4 4     4   SAB TAB Ectopic Multiple Live Births          4    # Outcome Date GA Lbr Len/2nd Weight Sex Delivery Anes PTL Lv  4 Term     F CS-Unspec  N LIV  3 Term     F CS-Unspec  N LIV  2 Term     M CS-Unspec  N LIV  1 Term     M CS-Unspec  N LIV     Birth Comments: DECEASED THREE DAYS AFTER BEING BORN-      ROS: A ROS was performed and pertinent positives and negatives are included in the history.  GENERAL: No fevers or chills. HEENT: No change in vision, no earache, sore throat or sinus congestion. NECK: No pain or stiffness. CARDIOVASCULAR: No chest pain or pressure. No palpitations. PULMONARY: No shortness of breath, cough or wheeze. GASTROINTESTINAL: No abdominal pain, nausea, vomiting or diarrhea, melena or bright red blood per rectum. GENITOURINARY: No urinary frequency, urgency, hesitancy or dysuria. MUSCULOSKELETAL: No joint or muscle pain, no back pain, no recent trauma. DERMATOLOGIC: No rash, no itching, no lesions. ENDOCRINE: No polyuria, polydipsia, no heat or cold  intolerance. No recent change in weight. HEMATOLOGICAL: No anemia or easy bruising or bleeding. NEUROLOGIC: No headache, seizures, numbness, tingling or weakness. PSYCHIATRIC: No depression, no loss of interest in normal activity or change in sleep pattern.     Exam:   BP 130/82   Ht 4\' 10"  (1.473 m)   Wt 150 lb (68 kg)   LMP 07/07/2018   BMI 31.35 kg/m   Body mass index is 31.35 kg/m.  General appearance : Well developed well nourished female. No acute distress HEENT: Eyes: no retinal hemorrhage or exudates,  Neck supple, trachea midline, no carotid bruits, no thyroidmegaly Lungs: Clear to auscultation, no rhonchi or wheezes, or rib retractions  Heart: Regular rate and rhythm, no murmurs or gallops Breast:Examined in sitting and supine position were symmetrical in appearance, no palpable masses or tenderness,  no skin retraction, no nipple inversion, no nipple discharge, no skin discoloration, no axillary or supraclavicular lymphadenopathy Abdomen: no palpable masses or tenderness, no rebound or guarding Extremities: no edema or skin discoloration or tenderness  Pelvic: Vulva: Normal             Vagina: No gross lesions or discharge  Cervix: No gross lesions or discharge.  Pap reflex done  Uterus AV 9-10 cm mobile, stable Fibroids, non-tender  Adnexa  Without masses or tenderness  Anus: Normal   Assessment/Plan:  48 y.o. female for annual  exam   1. Encounter for gynecological examination with Papanicolaou smear of cervix Stable small fibroids on gynecologic exam.  Pap reflex done today.  Breast exam normal.  Will schedule screening mammogram now.  Health labs at work. - Pap IG w/ reflex to HPV when ASC-U  2. S/P tubal ligation  3. Fibroids Stable asymptomatic fibroids.  Decision to observe.  4. Class 1 obesity due to excess calories without serious comorbidity with body mass index (BMI) of 31.0 to 31.9 in adult Recommend a lower calorie/carb diet such as [employer].   Aerobic physical activities 5 times a week and weightlifting every 2 days.  Other orders - Omega-3 1400 MG CAPS; Take by mouth.  [name] MD, 4:10 PM 07/07/2018

## 2018-07-08 ENCOUNTER — Encounter: Payer: Self-pay | Admitting: Obstetrics & Gynecology

## 2018-07-08 LAB — PAP IG W/ RFLX HPV ASCU

## 2018-07-08 NOTE — Patient Instructions (Signed)
1. Encounter for gynecological examination with Papanicolaou smear of cervix Stable small fibroids on gynecologic exam.  Pap reflex done today.  Breast exam normal.  Will schedule screening mammogram now.  Health labs at work. - Pap IG w/ reflex to HPV when ASC-U  2. S/P tubal ligation  3. Fibroids Stable asymptomatic fibroids.  Decision to observe.  4. Class 1 obesity due to excess calories without serious comorbidity with body mass index (BMI) of 31.0 to 31.9 in adult Recommend a lower calorie/carb diet such as [employer].  Aerobic physical activities 5 times a week and weightlifting every 2 days.  Other orders - Omega-3 1400 MG CAPS; Take by mouth.  Spero Geralds, fue un placer verle hoy!  Voy a informarle de sus [employer].

## 2018-10-05 DIAGNOSIS — L579 Skin changes due to chronic exposure to nonionizing radiation, unspecified: Secondary | ICD-10-CM | POA: Diagnosis not present

## 2018-10-05 DIAGNOSIS — L814 Other melanin hyperpigmentation: Secondary | ICD-10-CM | POA: Diagnosis not present

## 2018-10-05 DIAGNOSIS — B079 Viral wart, unspecified: Secondary | ICD-10-CM | POA: Diagnosis not present

## 2018-11-03 DIAGNOSIS — L579 Skin changes due to chronic exposure to nonionizing radiation, unspecified: Secondary | ICD-10-CM | POA: Diagnosis not present

## 2018-11-03 DIAGNOSIS — B078 Other viral warts: Secondary | ICD-10-CM | POA: Diagnosis not present

## 2018-11-03 DIAGNOSIS — D485 Neoplasm of uncertain behavior of skin: Secondary | ICD-10-CM | POA: Diagnosis not present

## 2019-09-13 ENCOUNTER — Encounter: Payer: BC Managed Care – PPO | Admitting: Obstetrics & Gynecology

## 2019-09-13 DIAGNOSIS — Z1231 Encounter for screening mammogram for malignant neoplasm of breast: Secondary | ICD-10-CM | POA: Diagnosis not present

## 2019-11-09 ENCOUNTER — Ambulatory Visit: Payer: BC Managed Care – PPO | Admitting: Obstetrics & Gynecology

## 2019-11-09 ENCOUNTER — Other Ambulatory Visit: Payer: Self-pay

## 2019-11-09 ENCOUNTER — Encounter: Payer: Self-pay | Admitting: Obstetrics & Gynecology

## 2019-11-09 VITALS — BP 122/78 | Ht <= 58 in | Wt 160.0 lb

## 2019-11-09 DIAGNOSIS — Z6833 Body mass index (BMI) 33.0-33.9, adult: Secondary | ICD-10-CM

## 2019-11-09 DIAGNOSIS — D219 Benign neoplasm of connective and other soft tissue, unspecified: Secondary | ICD-10-CM | POA: Diagnosis not present

## 2019-11-09 DIAGNOSIS — Z9851 Tubal ligation status: Secondary | ICD-10-CM

## 2019-11-09 DIAGNOSIS — E6609 Other obesity due to excess calories: Secondary | ICD-10-CM

## 2019-11-09 DIAGNOSIS — Z01419 Encounter for gynecological examination (general) (routine) without abnormal findings: Secondary | ICD-10-CM

## 2019-11-09 NOTE — Progress Notes (Signed)
Helen Mccarthy 1970-08-28 [id number]   History:    49 y.o. G4P4L4 Married. S/P TL.  RP:  Established patient presenting for annual gyn exam   HPI: Menses are regular every month with normal flow.  No BTB.  No pelvic pain.  Known Uterine Fibroids, had a [place] Myosure Excision of SM Myoma/D+C on 08/28/2017.  No pain with IC. Urine/BMs normal.  Breasts normal.  BMI 33.44.  Enjoys Zumba.  [facility] [employer] at work.  Past medical history,surgical history, family history and social history were all reviewed and documented in the EPIC chart.  Gynecologic History Patient's last menstrual period was 10/18/2019.  Obstetric History OB History  Gravida Para Term Preterm AB Living  4 4 4     4   SAB TAB Ectopic Multiple Live Births          4    # Outcome Date GA Lbr Len/2nd Weight Sex Delivery Anes PTL Lv  4 Term     F CS-Unspec  N LIV  3 Term     F CS-Unspec  N LIV  2 Term     M CS-Unspec  N LIV  1 Term     M CS-Unspec  N LIV     Birth Comments: DECEASED THREE DAYS AFTER BEING BORN-      ROS: A ROS was performed and pertinent positives and negatives are included in the history.  GENERAL: No fevers or chills. HEENT: No change in vision, no earache, sore throat or sinus congestion. NECK: No pain or stiffness. CARDIOVASCULAR: No chest pain or pressure. No palpitations. PULMONARY: No shortness of breath, cough or wheeze. GASTROINTESTINAL: No abdominal pain, nausea, vomiting or diarrhea, melena or bright red blood per rectum. GENITOURINARY: No urinary frequency, urgency, hesitancy or dysuria. MUSCULOSKELETAL: No joint or muscle pain, no back pain, no recent trauma. DERMATOLOGIC: No rash, no itching, no lesions. ENDOCRINE: No polyuria, polydipsia, no heat or cold intolerance. No recent change in weight. HEMATOLOGICAL: No anemia or easy bruising or bleeding. NEUROLOGIC: No headache, seizures, numbness, tingling or weakness. PSYCHIATRIC: No depression, no loss of interest in normal activity or  change in sleep pattern.     Exam:   BP 122/78    Ht 4\' 10"  (1.473 m)    Wt 160 lb (72.6 kg)    LMP 10/18/2019    BMI 33.44 kg/m   Body mass index is 33.44 kg/m.  General appearance : Well developed well nourished female. No acute distress HEENT: Eyes: no retinal hemorrhage or exudates,  Neck supple, trachea midline, no carotid bruits, no thyroidmegaly Lungs: Clear to auscultation, no rhonchi or wheezes, or rib retractions  Heart: Regular rate and rhythm, no murmurs or gallops Breast:Examined in sitting and supine position were symmetrical in appearance, no palpable masses or tenderness,  no skin retraction, no nipple inversion, no nipple discharge, no skin discoloration, no axillary or supraclavicular lymphadenopathy Abdomen: no palpable masses or tenderness, no rebound or guarding Extremities: no edema or skin discoloration or tenderness  Pelvic: Vulva: Normal             Vagina: No gross lesions or discharge  Cervix: No gross lesions or discharge.  Pap reflex done.  Uterus  AV, about 10 cm in diameter, nodular, non-tender and mobile  Adnexa  Without masses or tenderness  Anus: Normal   Assessment/Plan:  49 y.o. female for annual exam   1. Encounter for gynecological examination with Papanicolaou smear of cervix Gynecologic exam stable with fibroids.  Pap reflex  done.  Breast exam normal.  Last screening mammogram September 2021 was negative.  Health labs at work.  2. S/P tubal ligation  3. Fibroids Asymptomatic known uterine fibroids.  We will continue to observe.  4. Class 1 obesity due to excess calories without serious comorbidity with body mass index (BMI) of 33.0 to 33.9 in adult Recommend a lower calorie/carb diet.  Aerobic activities 5 times a week and light weightlifting every 2 days.  [name] MD, 3:33 PM 11/09/2019

## 2019-11-10 LAB — PAP IG W/ RFLX HPV ASCU

## 2019-11-11 ENCOUNTER — Encounter: Payer: Self-pay | Admitting: Obstetrics & Gynecology

## 2019-11-14 ENCOUNTER — Telehealth: Payer: Self-pay | Admitting: *Deleted

## 2019-11-14 NOTE — Telephone Encounter (Signed)
Will route to [place] to find out where patient had her last breast imaging done in 2021. It is not listed in office visit.

## 2019-11-14 NOTE — Telephone Encounter (Signed)
-----   Message from [name], MD sent at 11/09/2019  4:45 PM EDT ----- Regarding: Needs to schedule Left Dx Mammo/Lt Breast US Screening Mammo 09/2019 was abnormal with Left breast calcifications.  Patient still didn't schedule a Left Dx mammo/Lt Breast US because of language barrier.  Please schedule.

## 2019-11-16 NOTE — Telephone Encounter (Signed)
I spoke with patient regarding her need to follow up on her Left Dx Mammo/Left Breast U/S. She will have her daughter call and schedule an appointment for her. Advised her to call if any questions.

## 2020-08-24 DIAGNOSIS — M25511 Pain in right shoulder: Secondary | ICD-10-CM | POA: Diagnosis not present

## 2020-08-24 DIAGNOSIS — S4991XA Unspecified injury of right shoulder and upper arm, initial encounter: Secondary | ICD-10-CM | POA: Diagnosis not present

## 2020-08-24 DIAGNOSIS — M79601 Pain in right arm: Secondary | ICD-10-CM | POA: Diagnosis not present

## 2020-08-24 DIAGNOSIS — M7531 Calcific tendinitis of right shoulder: Secondary | ICD-10-CM | POA: Diagnosis not present

## 2020-08-24 DIAGNOSIS — I10 Essential (primary) hypertension: Secondary | ICD-10-CM | POA: Diagnosis not present

## 2020-11-14 ENCOUNTER — Ambulatory Visit: Payer: BC Managed Care – PPO | Admitting: Obstetrics & Gynecology

## 2020-12-13 ENCOUNTER — Other Ambulatory Visit: Payer: Self-pay | Admitting: Obstetrics & Gynecology

## 2020-12-13 ENCOUNTER — Ambulatory Visit (INDEPENDENT_AMBULATORY_CARE_PROVIDER_SITE_OTHER): Payer: BC Managed Care – PPO | Admitting: Obstetrics & Gynecology

## 2020-12-13 ENCOUNTER — Encounter: Payer: Self-pay | Admitting: Obstetrics & Gynecology

## 2020-12-13 ENCOUNTER — Other Ambulatory Visit: Payer: Self-pay

## 2020-12-13 ENCOUNTER — Other Ambulatory Visit (HOSPITAL_COMMUNITY)
Admission: RE | Admit: 2020-12-13 | Discharge: 2020-12-13 | Disposition: A | Discharge status: Home / Self Care | Payer: BLUE CROSS/BLUE SHIELD | Source: Ambulatory Visit | Attending: Obstetrics & Gynecology | Admitting: Obstetrics & Gynecology

## 2020-12-13 VITALS — BP 116/78 | HR 74 | Resp 16 | Ht 58.25 in | Wt 149.0 lb

## 2020-12-13 DIAGNOSIS — D219 Benign neoplasm of connective and other soft tissue, unspecified: Secondary | ICD-10-CM

## 2020-12-13 DIAGNOSIS — Z9851 Tubal ligation status: Secondary | ICD-10-CM

## 2020-12-13 DIAGNOSIS — Z01419 Encounter for gynecological examination (general) (routine) without abnormal findings: Secondary | ICD-10-CM

## 2020-12-13 DIAGNOSIS — R928 Other abnormal and inconclusive findings on diagnostic imaging of breast: Secondary | ICD-10-CM

## 2020-12-13 NOTE — Progress Notes (Signed)
Helen Mccarthy 1970-08-09 [id number]   History:    50 y.o.  G4P4L4 Married. S/P TL.   RP:  Established patient presenting for annual gyn exam and Rt lower abdomen/severe Dysmenorrhea/Uterine Fibroids   HPI: Menses are regular every month with normal flow.  C/O Dysmenorrhea with cramps to the Rt lower abdomen. No BTB.  No pelvic pain.  Known Uterine Fibroids, had a [place] Myosure Excision of SM Myoma/D+C on 08/28/2017.  No pain with IC. Pap Neg 10/2019. Urine/BMs normal.  Breasts normal.  [facility]  BMI much improved at 30.87 (At 33.44 last year)  Enjoys Zumba, but hurt her Rt shoulder.  [facility] here today.  Schedule first screening [place].   Past medical history,surgical history, family history and social history were all reviewed and documented in the EPIC chart.  Gynecologic History Patient's last menstrual period was 12/03/2020 (exact date).  Obstetric History OB History  Gravida Para Term Preterm AB Living  _0 SAB IAB Ectopic Multiple Live Births          4    # Outcome Date GA Lbr Len/2nd Weight Sex Delivery Anes PTL Lv  4 Term     F CS-Unspec  N LIV  3 Term     F CS-Unspec  N LIV  2 Term     M CS-Unspec  N LIV  1 Term     M CS-Unspec  N LIV     Birth Comments: DECEASED THREE DAYS AFTER BEING BORN-      ROS: A ROS was performed and pertinent positives and negatives are included in the history.  GENERAL: No fevers or chills. HEENT: No change in vision, no earache, sore throat or sinus congestion. NECK: No pain or stiffness. CARDIOVASCULAR: No chest pain or pressure. No palpitations. PULMONARY: No shortness of breath, cough or wheeze. GASTROINTESTINAL: No abdominal pain, nausea, vomiting or diarrhea, melena or bright red blood per rectum. GENITOURINARY: No urinary frequency, urgency, hesitancy or dysuria. MUSCULOSKELETAL: No joint or muscle pain, no back pain, no recent trauma. DERMATOLOGIC: No rash, no  itching, no lesions. ENDOCRINE: No polyuria, polydipsia, no heat or cold intolerance. No recent change in weight. HEMATOLOGICAL: No anemia or easy bruising or bleeding. NEUROLOGIC: No headache, seizures, numbness, tingling or weakness. PSYCHIATRIC: No depression, no loss of interest in normal activity or change in sleep pattern.     Exam:   BP 116/78   Pulse 74   Resp 16   Ht 4' 10.25" (1.48 m)   Wt 149 lb (67.6 kg)   LMP 12/03/2020 (Exact Date) Comment: btl  BMI 30.87 kg/m   Body mass index is 30.87 kg/m.  General appearance : Well developed well nourished female. No acute distress HEENT: Eyes: no retinal hemorrhage or exudates,  Neck supple, trachea midline, no carotid bruits, no thyroidmegaly Lungs: Clear to auscultation, no rhonchi or wheezes, or rib retractions  Heart: Regular rate and rhythm, no murmurs or gallops Breast:Examined in sitting and supine position were symmetrical in appearance, no palpable masses or tenderness,  no skin retraction, no nipple inversion, no nipple discharge, no skin discoloration, no axillary or supraclavicular lymphadenopathy Abdomen: no palpable masses or tenderness, no rebound or guarding Extremities: no edema or skin discoloration or tenderness  Pelvic: Vulva: Normal             Vagina: No gross lesions or discharge  Cervix: No gross lesions or discharge.  Pap reflex done.  Uterus  AV, more to the Rt, increased in size with Fibroids, about 14-16 cm, non-tender and mobile  Adnexa  Without masses or tenderness  Anus: Normal   Assessment/Plan:  50 y.o. female for annual exam   1. Encounter for gynecological examination with Papanicolaou smear of cervix Menses are regular every month with normal flow.  C/O Dysmenorrhea with cramps to the Rt lower abdomen. No BTB.  No pelvic pain.  Known Uterine Fibroids, had a [facility] Myosure Excision of SM Myoma/D+C on 08/28/2017.  No pain with IC. Pap Neg 10/2019. Urine/BMs normal.  Breasts normal.  [facility]  BMI much improved at 30.87 (At 33.44 last year)  Enjoys Zumba, but hurt her Rt shoulder.  [facility] here today.  Schedule first screening [facility]. - CBC - Comp Met (CMET) - TSH - Lipid Profile - Vitamin D 1,25 dihydroxy - Cytology - PAP( [place])  2. S/P tubal ligation  3. Fibroids Menses are regular every month with normal flow.  C/O Dysmenorrhea with cramps to the Rt lower abdomen. No BTB.  No pelvic pain.  Known Uterine Fibroids, had a [facility] Myosure Excision of SM Myoma/D+C on 08/28/2017.  No pain with IC.  Desires definitive management with Hysterectomy.  Declines hormonal therapy and conservative surgery again.  Will r/o anemia with a CBC today and reassess the uterine Fibroids/Endometrial line/ovaries by Pelvic US at f/u.  Schedule XI TLH/BS in December per patient's preference if possible.  F/U Pelvic US and Preop.   - CBC - US Transvaginal Non-OB; Future  Other orders - cyclobenzaprine (FLEXERIL) 10 MG tablet; Take 10 mg by mouth 2 (two) times daily as needed. - naproxen (NAPROSYN) 500 MG tablet; Take 500 mg by mouth 2 (two) times daily. - Ascorbic Acid (VITAMIN C PO); Take by mouth. - VITAMIN D PO; Take by mouth.   [name]  MD, 9:23 AM 12/13/2020    

## 2020-12-14 LAB — CYTOLOGY - PAP: Diagnosis: NEGATIVE

## 2020-12-17 LAB — COMPREHENSIVE METABOLIC PANEL
AG Ratio: 1.2 (calc) (ref 1.0–2.5)
ALT: 12 U/L (ref 6–29)
AST: 20 U/L (ref 10–35)
Albumin: 3.5 g/dL — ABNORMAL LOW (ref 3.6–5.1)
Alkaline phosphatase (APISO): 79 U/L (ref 31–125)
BUN/Creatinine Ratio: 25 (calc) — ABNORMAL HIGH (ref 6–22)
BUN: 12 mg/dL (ref 7–25)
CO2: 28 mmol/L (ref 20–32)
Calcium: 8.8 mg/dL (ref 8.6–10.2)
Chloride: 107 mmol/L (ref 98–110)
Creat: 0.48 mg/dL — ABNORMAL LOW (ref 0.50–0.99)
Globulin: 2.9 g/dL (calc) (ref 1.9–3.7)
Glucose, Bld: 90 mg/dL (ref 65–99)
Potassium: 3.8 mmol/L (ref 3.5–5.3)
Sodium: 141 mmol/L (ref 135–146)
Total Bilirubin: 0.3 mg/dL (ref 0.2–1.2)
Total Protein: 6.4 g/dL (ref 6.1–8.1)

## 2020-12-17 LAB — LIPID PANEL
Cholesterol: 221 mg/dL — ABNORMAL HIGH (ref <200)
HDL: 60 mg/dL (ref >=50)
LDL Cholesterol (Calc): 141 mg/dL (calc) — ABNORMAL HIGH
Non-HDL Cholesterol (Calc): 161 mg/dL (calc) — ABNORMAL HIGH (ref <130)
Total CHOL/HDL Ratio: 3.7 (calc) (ref <5.0)
Triglycerides: 96 mg/dL (ref <150)

## 2020-12-17 LAB — CBC
HCT: 37.6 % (ref 35.0–45.0)
Hemoglobin: 12.2 g/dL (ref 11.7–15.5)
MCH: 24.8 pg — ABNORMAL LOW (ref 27.0–33.0)
MCHC: 32.4 g/dL (ref 32.0–36.0)
MCV: 76.4 fL — ABNORMAL LOW (ref 80.0–100.0)
MPV: 10.5 fL (ref 7.5–12.5)
Platelets: 370 10*3/uL (ref 140–400)
RBC: 4.92 10*6/uL (ref 3.80–5.10)
RDW: 14.4 % (ref 11.0–15.0)
WBC: 8.2 10*3/uL (ref 3.8–10.8)

## 2020-12-17 LAB — VITAMIN D 1,25 DIHYDROXY
Vitamin D 1, 25 (OH)2 Total: 59 pg/mL (ref 18–72)
Vitamin D2 1, 25 (OH)2: <8 pg/mL
Vitamin D3 1, 25 (OH)2: 59 pg/mL

## 2020-12-17 LAB — TSH: TSH: 0.17 mIU/L — ABNORMAL LOW

## 2021-01-29 ENCOUNTER — Other Ambulatory Visit: Payer: BC Managed Care – PPO

## 2021-01-29 ENCOUNTER — Other Ambulatory Visit: Payer: BC Managed Care – PPO | Admitting: Obstetrics & Gynecology

## 2021-02-12 ENCOUNTER — Other Ambulatory Visit: Payer: Self-pay | Admitting: Obstetrics & Gynecology

## 2021-02-12 ENCOUNTER — Encounter: Payer: Self-pay | Admitting: Obstetrics & Gynecology

## 2021-02-12 ENCOUNTER — Other Ambulatory Visit: Payer: Self-pay

## 2021-02-12 ENCOUNTER — Ambulatory Visit: Payer: BC Managed Care – PPO | Admitting: Obstetrics & Gynecology

## 2021-02-12 ENCOUNTER — Ambulatory Visit (INDEPENDENT_AMBULATORY_CARE_PROVIDER_SITE_OTHER): Payer: BC Managed Care – PPO

## 2021-02-12 VITALS — BP 110/64

## 2021-02-12 DIAGNOSIS — R1031 Right lower quadrant pain: Secondary | ICD-10-CM | POA: Diagnosis not present

## 2021-02-12 DIAGNOSIS — D219 Benign neoplasm of connective and other soft tissue, unspecified: Secondary | ICD-10-CM

## 2021-02-12 NOTE — Progress Notes (Signed)
° ° °  Shaya Avila-Bracamontes 1970/03/17 [id number]        51 y.o.  G4P4L4   RP: Right lower quadrant pain/Fibroids for pelvic US  HPI: Uncomfortable on the right lower quadrant, especially when sitting.  Menses every month with heavy flow.  Last Pelvic US showed Fibroids in 2019.   OB History  Gravida Para Term Preterm AB Living  4 4 4     4   SAB IAB Ectopic Multiple Live Births          4    # Outcome Date GA Lbr Len/2nd Weight Sex Delivery Anes PTL Lv  4 Term     F CS-Unspec  N LIV  3 Term     F CS-Unspec  N LIV  2 Term     M CS-Unspec  N LIV  1 Term     M CS-Unspec  N LIV     Birth Comments: DECEASED THREE DAYS AFTER BEING BORN-     Past medical history,surgical history, problem list, medications, allergies, family history and social history were all reviewed and documented in the EPIC chart.   Directed ROS with pertinent positives and negatives documented in the history of present illness/assessment and plan.  Exam:  Vitals:   02/12/21 1511  BP: 110/64   General appearance:  Normal  Pelvic US today: Comparison is made with previous scan in 2019.  Transabdominal scan.  Grossly enlarged uterus with large fibroid to the right fundus measured at more than 12 x 8 cm.  Both uterus and fibroids are increased in size since previous scan.  The large fibroid is very tender to palpation.  3 other smaller fibroids are present to the left fundus.  The overall uterine size is measured at 18.73 x 13.66 x 10.5 cm.  The volume is estimated at 1406.6 cm cube.  The endometrial lining is symmetrical measured at 7.8 mm with no thickening or mass seen.  Both ovaries are identified and normal in size with sparse follicles.  No free fluid in the pelvis.   Assessment/Plan:  51 y.o. U2P5361   1. Right lower quadrant pain Right lower quadrant abdominal pain most likely due to the large tender fibroid at the right uterine side.  Given the increase in size since the last pelvic ultrasound with  the fact that the fibroids are very large and tender with right pelvic pain with heavy regular menses at age 63, the decision is made to proceed with a total abdominal hysterectomy with bilateral salpingectomy and preservation of the ovaries.  Patient agrees with the plan.  We will follow-up for the preop visit.  2. Fibroids Very large Fibroids increasing in size.  Decision to proceed with TAH/Bilateral Salpingectomy.  Other orders - methocarbamol (ROBAXIN) 500 MG tablet; Take 500 mg by mouth every 6 (six) hours as needed. - oxyCODONE (OXY IR/ROXICODONE) 5 MG immediate release tablet; Take 5 mg by mouth every 6 (six) hours as needed.   [name] MD, 3:41 PM 02/12/2021

## 2021-02-13 ENCOUNTER — Telehealth: Payer: Self-pay

## 2021-02-13 DIAGNOSIS — C50919 Malignant neoplasm of unspecified site of unspecified female breast: Secondary | ICD-10-CM

## 2021-02-13 HISTORY — DX: Malignant neoplasm of unspecified site of unspecified female breast: C50.919

## 2021-02-13 NOTE — Telephone Encounter (Signed)
-----   Message from [name], MD sent at 02/12/2021  8:24 PM EST ----- Regarding: Schedule surgery Surgery: Total Abdominal Hysterectomy with Bilateral Salpingectomy  Diagnosis: D25.9 Fibroids, N92.0 Menorrhagia with Regular Cycles, Right pelvic pain  Location: [place]  Status: Outpatient with Overnight Bed  Time: 90 Minutes  Assistant: First Available Provider.  Olivia Mackie if possible.  Urgency: First Available  Pre-Op Appointment: To Be Scheduled  Post-Op Appointment(s): 2 Weeks, 6 Weeks  Time Out Of Work: 6 Weeks

## 2021-02-15 NOTE — Telephone Encounter (Signed)
Helen Mccarthy contacted patient to review surgery dates. Patient was asking if ok to schedule before recovery time completed from shoulder surgery. Patient did not discuss with Dr. Dellis Filbert while in office. I requested patient to contact surgeon that did her surgery to discuss if ok to proceed with scheduling TAH. See message below.   Surgical clearance faxed to [phone].

## 2021-02-15 NOTE — Telephone Encounter (Signed)
-----   Message from Leron Croak, [place] sent at 02/14/2021 11:20 AM EST ----- Gar Ponto called me this morning to let us know that she called her doctor's office yesterday. Dr Flossie Dibble at [facility] and was told that our office needs to send him a form requesting authorization for the surgery that Denelle would like to have. Her surgery was done 2 months ago it was on dec/07/2020.

## 2021-02-18 NOTE — Telephone Encounter (Signed)
Routing to [employer]

## 2021-02-18 NOTE — Telephone Encounter (Signed)
Thurnell Garbe A, CMA  You 6 minutes ago (4:00 PM)   Patient said she would like to schedule sx on 03/19/2021.     Thurnell Garbe A, CMA  You 34 minutes ago (3:32 PM)   [facility]  She is ready to schedule. I told her I will give her a call back to let her know of all the information pertaining to this sx.

## 2021-02-18 NOTE — Telephone Encounter (Signed)
Surgical clearance received from Dr. French Ana.  To Dr. Dellis Filbert to review and sign.   Helen Mccarthy -is patient ready to proceed with scheduling?

## 2021-02-18 NOTE — Telephone Encounter (Signed)
Surgery request sent for 03/19/21

## 2021-02-20 NOTE — Telephone Encounter (Signed)
Per Earnest Bailey, patient acknowledges understanding of information presented for surgery benefits. Patient is aware that benefits presented are professional benefits only. Patient is aware the hospital will call with facility benefits. See account note.

## 2021-02-22 ENCOUNTER — Ambulatory Visit
Admission: RE | Admit: 2021-02-22 | Discharge: 2021-02-22 | Disposition: A | Discharge status: Home / Self Care | Payer: BC Managed Care – PPO | Source: Ambulatory Visit | Attending: Obstetrics & Gynecology | Admitting: Obstetrics & Gynecology

## 2021-02-22 ENCOUNTER — Other Ambulatory Visit: Payer: Self-pay | Admitting: Obstetrics & Gynecology

## 2021-02-22 ENCOUNTER — Other Ambulatory Visit: Payer: Self-pay

## 2021-02-22 DIAGNOSIS — N6320 Unspecified lump in the left breast, unspecified quadrant: Secondary | ICD-10-CM

## 2021-02-22 DIAGNOSIS — N6321 Unspecified lump in the left breast, upper outer quadrant: Secondary | ICD-10-CM | POA: Diagnosis not present

## 2021-02-22 DIAGNOSIS — R928 Other abnormal and inconclusive findings on diagnostic imaging of breast: Secondary | ICD-10-CM

## 2021-02-22 DIAGNOSIS — R921 Mammographic calcification found on diagnostic imaging of breast: Secondary | ICD-10-CM | POA: Diagnosis not present

## 2021-02-22 DIAGNOSIS — R922 Inconclusive mammogram: Secondary | ICD-10-CM | POA: Diagnosis not present

## 2021-02-25 NOTE — Telephone Encounter (Signed)
Routing to [place] to call and review pre-op instructions to include post-op appts.

## 2021-02-26 NOTE — Telephone Encounter (Signed)
Thurnell Garbe A, CMA  You 20 hours ago (3:20 PM)   The patient was given all the information on the Surgery Information Form. All the information was given to the patient in Spanish. I advised her to call back if any other additional questions.   Routing to provider for final review. Patient is agreeable to disposition. Will close encounter.  Cc: Hayley Carder

## 2021-03-01 ENCOUNTER — Ambulatory Visit
Admission: RE | Admit: 2021-03-01 | Discharge: 2021-03-01 | Disposition: A | Discharge status: Home / Self Care | Payer: BC Managed Care – PPO | Source: Ambulatory Visit | Attending: Obstetrics & Gynecology | Admitting: Obstetrics & Gynecology

## 2021-03-01 ENCOUNTER — Other Ambulatory Visit: Payer: Self-pay

## 2021-03-01 DIAGNOSIS — R928 Other abnormal and inconclusive findings on diagnostic imaging of breast: Secondary | ICD-10-CM

## 2021-03-01 DIAGNOSIS — D0512 Intraductal carcinoma in situ of left breast: Secondary | ICD-10-CM | POA: Diagnosis not present

## 2021-03-01 DIAGNOSIS — R921 Mammographic calcification found on diagnostic imaging of breast: Secondary | ICD-10-CM | POA: Diagnosis not present

## 2021-03-01 DIAGNOSIS — D493 Neoplasm of unspecified behavior of breast: Secondary | ICD-10-CM | POA: Diagnosis not present

## 2021-03-01 DIAGNOSIS — N6321 Unspecified lump in the left breast, upper outer quadrant: Secondary | ICD-10-CM | POA: Diagnosis not present

## 2021-03-01 HISTORY — PX: BREAST BIOPSY: SHX20

## 2021-03-05 ENCOUNTER — Other Ambulatory Visit: Payer: Self-pay | Admitting: Obstetrics & Gynecology

## 2021-03-05 ENCOUNTER — Telehealth: Payer: Self-pay | Admitting: Hematology and Oncology

## 2021-03-05 DIAGNOSIS — R599 Enlarged lymph nodes, unspecified: Secondary | ICD-10-CM

## 2021-03-05 NOTE — Telephone Encounter (Signed)
Spoke to patient through interpreter to confirm morning clinic appointment for 3/1, packet will be mailed

## 2021-03-06 ENCOUNTER — Ambulatory Visit
Admission: RE | Admit: 2021-03-06 | Discharge: 2021-03-06 | Disposition: A | Discharge status: Home / Self Care | Payer: BC Managed Care – PPO | Source: Ambulatory Visit | Attending: Obstetrics & Gynecology | Admitting: Obstetrics & Gynecology

## 2021-03-06 ENCOUNTER — Other Ambulatory Visit: Payer: Self-pay

## 2021-03-06 DIAGNOSIS — D0512 Intraductal carcinoma in situ of left breast: Secondary | ICD-10-CM | POA: Diagnosis not present

## 2021-03-06 DIAGNOSIS — R590 Localized enlarged lymph nodes: Secondary | ICD-10-CM | POA: Diagnosis not present

## 2021-03-06 DIAGNOSIS — R599 Enlarged lymph nodes, unspecified: Secondary | ICD-10-CM

## 2021-03-06 HISTORY — PX: BREAST BIOPSY: SHX20

## 2021-03-07 ENCOUNTER — Encounter (HOSPITAL_BASED_OUTPATIENT_CLINIC_OR_DEPARTMENT_OTHER): Payer: Self-pay | Admitting: Obstetrics & Gynecology

## 2021-03-07 ENCOUNTER — Encounter: Payer: Self-pay | Admitting: *Deleted

## 2021-03-07 DIAGNOSIS — D0512 Intraductal carcinoma in situ of left breast: Secondary | ICD-10-CM | POA: Insufficient documentation

## 2021-03-08 NOTE — Progress Notes (Signed)
Left message at home and mobile # asking patient to call for pre-op phone call via Wilhemena Durie a [place] interpreter [phone] with [employer].

## 2021-03-11 ENCOUNTER — Other Ambulatory Visit: Payer: Self-pay

## 2021-03-11 ENCOUNTER — Encounter (HOSPITAL_BASED_OUTPATIENT_CLINIC_OR_DEPARTMENT_OTHER): Payer: Self-pay | Admitting: Obstetrics & Gynecology

## 2021-03-11 NOTE — Progress Notes (Addendum)
Radiation Oncology         [phone] ________________________________  Name: Helen Mccarthy        MRN: [id number]  Date of Service: 03/13/2021 DOB: 05/23/1970  XN:[id number], Truddie Hidden, MD  Coralie Keens, MD     REFERRING PHYSICIAN: Coralie Keens, MD   DIAGNOSIS: The encounter diagnosis was Ductal carcinoma in situ (DCIS) of left breast.   HISTORY OF PRESENT ILLNESS: Helen Mccarthy is a 51 y.o. female seen in the multidisciplinary breast clinic for a new diagnosis of left breast cancer. The patient was noted to have diagnostic follow up after having known calcifications in the left breast. Her follow up imaging showed a group of calcifications in the left breast measuring up to 1.1 cm in the outer quadrants, and a separate lesion  in the 2:00 position measuring up to 1.7 cm. There was one borderline appearing lymph node in the left axilla. She underwent biopsy on 03/01/20 of the 2:00 position that showed flat epithelial neoplasm favor benign adenoma. The left upper outer breast showed intermediate grade DCIS, cribriform type with necrosis. The cancer was ER/PR positive. Another left biopsy was consistent with fibroepithelial neoplasm favor fibroadenoma. Her node also biopsied and was negative on 03/06/21 She's seen to discuss treatment recommendations of her cancer.     PREVIOUS RADIATION THERAPY: No   PAST MEDICAL HISTORY:  Past Medical History:  Diagnosis Date   Anemia    heavy menstrual bleeding and fibroids   Breast cancer ([place]) 02/2021   ductal carcinoma in situ left breast   Elevated TSH    Endometrial mass    History of PID    Hyperlipidemia    Menorrhagia    Uterine fibroid        PAST SURGICAL HISTORY: Past Surgical History:  Procedure Laterality Date   BREAST BIOPSY Left 03/01/2021   BREAST BIOPSY Left 03/06/2021   CESAREAN SECTION     X4   DILATATION & CURETTAGE/HYSTEROSCOPY WITH MYOSURE N/A 08/28/2017   Procedure: [place];  Surgeon: [name], MD;  Location: [place];  Service: Gynecology;  Laterality: N/A;  requests 7:30am OR time in [place] [facility] block requests XL Myosure  requests one hour OR time   KNEE SURGERY Right    SHOULDER SURGERY Right    TUBAL LIGATION       FAMILY HISTORY:  Family History  Problem Relation Age of Onset   Hypertension Mother    Hypertension Father    Diabetes Paternal Aunt    Diabetes Paternal Uncle      SOCIAL HISTORY:  reports that she has never smoked. She has never used smokeless tobacco. She reports current alcohol use. She reports that she does not use drugs. The patient is married and lives in [place], [place]. She works in [place]. She is accompanied by her daughter who assists in translating for her.   ALLERGIES: Patient has no known allergies.   MEDICATIONS:  Current Outpatient Medications  Medication Sig Dispense Refill   Ascorbic Acid (VITAMIN C PO) Take by mouth.     calcium carbonate (OSCAL) 1500 (600 Ca) MG TABS tablet Take by mouth 2 (two) times daily with a meal.     ferrous sulfate 325 (65 FE) MG tablet Take 325 mg by mouth daily with breakfast. Ran out a month ago per pt on 03/11/21.     methocarbamol (ROBAXIN) 500 MG tablet Take 500 mg by mouth every 6 (six) hours as  needed.     Multiple Vitamin (MULTIVITAMIN) tablet Take 1 tablet by mouth daily.     Omega-3 1400 MG CAPS Take by mouth.     oxyCODONE (OXY IR/ROXICODONE) 5 MG immediate release tablet Take 5 mg by mouth every 6 (six) hours as needed.     VITAMIN D PO Take by mouth.     No current facility-administered medications for this visit.     REVIEW OF SYSTEMS: On review of systems, the patient reports that she is doing okay overall. No specific breast complaints are verbalized. No other concerns are noted.      PHYSICAL EXAM:  Wt Readings from Last 3 Encounters:  12/13/20 149 lb (67.6 kg)  11/09/19 160 lb (72.6 kg)   07/07/18 150 lb (68 kg)   Temp Readings from Last 3 Encounters:  08/28/17 97.7 F (36.5 C) (Oral)   BP Readings from Last 3 Encounters:  02/12/21 110/64  12/13/20 116/78  11/09/19 122/78   Pulse Readings from Last 3 Encounters:  12/13/20 74  08/28/17 (!) 57    In general this is a well appearing hispanic female in no acute distress. She's alert and oriented x4 and appropriate throughout the examination. Cardiopulmonary assessment is negative for acute distress and she exhibits normal effort. Bilateral breast exam is deferred.    ECOG = 0  0 - Asymptomatic (Fully active, able to carry on all predisease activities without restriction)  1 - Symptomatic but completely ambulatory (Restricted in physically strenuous activity but ambulatory and able to carry out work of a light or sedentary nature. For example, light housework, office work)  2 - Symptomatic, <50% in bed during the day (Ambulatory and capable of all self care but unable to carry out any work activities. Up and about more than 50% of waking hours)  3 - Symptomatic, >50% in bed, but not bedbound (Capable of only limited self-care, confined to bed or chair 50% or more of waking hours)  4 - Bedbound (Completely disabled. Cannot carry on any self-care. Totally confined to bed or chair)  5 - Death   Eustace Pen MM, Creech RH, Tormey DC, et al. [phone]). "Toxicity and response criteria of the [facility]". [place] Oncol. 5 (6): 649-55    LABORATORY DATA:  Lab Results  Component Value Date   WBC 8.2 12/13/2020   HGB 12.2 12/13/2020   HCT 37.6 12/13/2020   MCV 76.4 (L) 12/13/2020   PLT 370 12/13/2020   Lab Results  Component Value Date   NA 141 12/13/2020   K 3.8 12/13/2020   CL 107 12/13/2020   CO2 28 12/13/2020   Lab Results  Component Value Date   ALT 12 12/13/2020   AST 20 12/13/2020   ALKPHOS 60 05/21/2016   BILITOT 0.3 12/13/2020      RADIOGRAPHY: US Pelvis Complete  Result  Date: 02/12/2021 Comparison is made with previous scan in 2019.  Transabdominal scan.  Grossly enlarged uterus with large fibroid to the right fundus measured at more than 12 x 8 cm.  Both uterus and fibroids are increased in size since previous scan.  The large fibroid is very tender to palpation.  3 other smaller fibroids are present to the left fundus.  The overall uterine size is measured at 18.73 x 13.66 x 10.5 cm.  The volume is estimated at 1406.6 cm cube.  The endometrial lining is symmetrical measured at 7.8 mm with no thickening or mass seen.  Both ovaries are identified  and normal in size with sparse follicles.  No free fluid in the pelvis.is made with previous scan in 2019.  Transabdominal scan.  Grossly enlarged uterus with large fibroid to the right fundus measured at more than 12 x 8 cm.  Both uterus and fibroids are increased in size since previous scan.  The large fibroid is very tender to palpation.  3 other smaller fibroids are present to the left fundus.  The overall uterine size is measured at 18.73 x 13.66 x 10.5 cm.  The volume is estimated at 1406.6 cm cube.  The endometrial lining is symmetrical measured at 7.8 mm with no thickening or mass seen.  Both ovaries are identified and normal in size with sparse follicles.  No free fluid in the pelvis.  US BREAST LTD UNI LEFT INC AXILLA  Result Date: 02/22/2021 CLINICAL DATA:  Delayed screening recall for left breast calcifications. She was recalled in August of 2021, but was lost to follow-up until this time. EXAM: DIGITAL DIAGNOSTIC BILATERAL MAMMOGRAM WITH TOMOSYNTHESIS AND CAD; ULTRASOUND LEFT BREAST LIMITED TECHNIQUE: Bilateral digital diagnostic mammography and breast tomosynthesis was performed. The images were evaluated with computer-aided detection.; Targeted ultrasound examination of the left breast was performed. COMPARISON:  Previous exam(s). ACR Breast Density Category c: The breast tissue is heterogeneously dense, which may  obscure small masses. FINDINGS: Spot compression magnification images through the upper outer quadrant of the left breast demonstrates a 1.1 cm group of punctate and amorphous calcifications. In the lateral posterior left breast there is an obscured oval mass. No suspicious calcifications, masses or areas of distortion are seen in the right breast. Ultrasound of the left breast at 2 o'clock, 7 cm from the nipple demonstrates a hypoechoic oval mass with slightly indistinct margins measuring 1.7 x 1.0 x 1.5 cm. Ultrasound of the left axilla demonstrates 1 lymph node with borderline cortex of 3 mm. IMPRESSION: 1. Indeterminate 1.1 cm group of calcifications in the upper-outer left breast. 2.  Indeterminate 1.7 cm mass in the left breast at 2 o'clock. 3. There is 1 borderline enlarged lymph node in the left axilla with cortex measuring just over 3 mm. 4.  No evidence of malignancy in the right breast. RECOMMENDATION: 1. Stereotactic biopsy is recommended for the calcifications in the left breast. 2. Ultrasound-guided biopsy is recommended for the left breast mass at 2 o'clock. We will assist to schedule the patient for the procedures at our earliest mutual convenience. 3. If either of the above biopsies are malignant, ultrasound-guided biopsy of the left axillary lymph node is recommended. If all is benign, no further follow-up is necessary. I have discussed the findings and recommendations with the patient. If applicable, a reminder letter will be sent to the patient regarding the next appointment. BI-RADS CATEGORY  4: Suspicious. Electronically Signed   By: Ammie Ferrier M.D.   On: 02/22/2021 13:24  MM DIAG BREAST TOMO BILATERAL  Result Date: 02/22/2021 CLINICAL DATA:  Delayed screening recall for left breast calcifications. She was recalled in August of 2021, but was lost to follow-up until this time. EXAM: DIGITAL DIAGNOSTIC BILATERAL MAMMOGRAM WITH TOMOSYNTHESIS AND CAD; ULTRASOUND LEFT BREAST LIMITED  TECHNIQUE: Bilateral digital diagnostic mammography and breast tomosynthesis was performed. The images were evaluated with computer-aided detection.; Targeted ultrasound examination of the left breast was performed. COMPARISON:  Previous exam(s). ACR Breast Density Category c: The breast tissue is heterogeneously dense, which may obscure small masses. FINDINGS: Spot compression magnification images through the upper outer quadrant of the left breast  demonstrates a 1.1 cm group of punctate and amorphous calcifications. In the lateral posterior left breast there is an obscured oval mass. No suspicious calcifications, masses or areas of distortion are seen in the right breast. Ultrasound of the left breast at 2 o'clock, 7 cm from the nipple demonstrates a hypoechoic oval mass with slightly indistinct margins measuring 1.7 x 1.0 x 1.5 cm. Ultrasound of the left axilla demonstrates 1 lymph node with borderline cortex of 3 mm. IMPRESSION: 1. Indeterminate 1.1 cm group of calcifications in the upper-outer left breast. 2.  Indeterminate 1.7 cm mass in the left breast at 2 o'clock. 3. There is 1 borderline enlarged lymph node in the left axilla with cortex measuring just over 3 mm. 4.  No evidence of malignancy in the right breast. RECOMMENDATION: 1. Stereotactic biopsy is recommended for the calcifications in the left breast. 2. Ultrasound-guided biopsy is recommended for the left breast mass at 2 o'clock. We will assist to schedule the patient for the procedures at our earliest mutual convenience. 3. If either of the above biopsies are malignant, ultrasound-guided biopsy of the left axillary lymph node is recommended. If all is benign, no further follow-up is necessary. I have discussed the findings and recommendations with the patient. If applicable, a reminder letter will be sent to the patient regarding the next appointment. BI-RADS CATEGORY  4: Suspicious. Electronically Signed   By: Ammie Ferrier M.D.   On:  02/22/2021 13:24  [place] AXILLARY NODE CORE BIOPSY LEFT  Addendum Date: 03/07/2021   ADDENDUM REPORT: 03/07/2021 15:59 ADDENDUM: Pathology revealed BENIGN LYMPH NODE- NO EVIDENCE OF MALIGNANCY of the LEFT axillary lymph node (tribell clip. This was found to be concordant by Dr. Nolon Nations. Pathology results were discussed with the patient by telephone with Kathrine Haddock Bilingual Patient Services Representative. The patient reported doing well after the biopsy with tenderness at the site. Post biopsy instructions and care were reviewed and questions were answered. The patient was encouraged to call [facility] for any additional concerns. The patient has a recent diagnosis of left breast cancer and should follow her outlined treatment plan. Pathology results reported by Stacie Acres RN on 03/07/2021. Electronically Signed   By: Nolon Nations M.D.   On: 03/07/2021 15:59   Result Date: 03/07/2021 CLINICAL DATA:  Patient presents for ultrasound-guided core biopsy of LEFT axillary lymph node. Recent diagnosis of DCIS in 2 sites in the LEFT breast. EXAM: [place] AXILLARY NODE CORE BIOPSY LEFT COMPARISON:  Previous exam(s). PROCEDURE: I met with the patient and we discussed the procedure of ultrasound-guided biopsy, including benefits and alternatives. We discussed the high likelihood of a successful procedure. We discussed the risks of the procedure, including infection, bleeding, tissue injury, clip migration, and inadequate sampling. Informed written consent was given. The usual time-out protocol was performed immediately prior to the procedure. Using sterile technique and 1% Lidocaine as local anesthetic, under direct ultrasound visualization, a 14 gauge spring-loaded device was used to perform biopsy of enlarged LEFT axillary lymph node using a MEDIAL to LATERAL approach. At the conclusion of the procedure tri bell tissue marker clip was deployed into the biopsy cavity. Follow up 2  view mammogram was performed and dictated separately. IMPRESSION: Ultrasound guided biopsy of LEFT axillary lymph node. No apparent complications. Electronically Signed: By: Nolon Nations M.D. On: 03/06/2021 13:57  MM CLIP PLACEMENT LEFT  Result Date: 03/06/2021 CLINICAL DATA:  Status post ultrasound-guided core biopsy of LEFT axillary lymph node. EXAM: 3D  DIAGNOSTIC LEFT MAMMOGRAM POST ULTRASOUND BIOPSY COMPARISON:  Previous exam(s). FINDINGS: 3D Mammographic images were obtained following ultrasound guided biopsy of enlarged LOWER LEFT axillary lymph node and placement of a tri bell clip. The biopsy marking clip is in expected position at the site of biopsy. IMPRESSION: Appropriate positioning of the tri bell shaped biopsy marking clip at the site of biopsy in the LEFT axilla. Final Assessment: Post Procedure Mammograms for Marker Placement Electronically Signed   By: Nolon Nations M.D.   On: 03/06/2021 15:22  MM CLIP PLACEMENT LEFT  Result Date: 03/01/2021 CLINICAL DATA:  Post biopsy mammogram of the left breast for clip placement. EXAM: 3D DIAGNOSTIC LEFT MAMMOGRAM POST STEREOTACTIC AND ULTRASOUND BIOPSIES COMPARISON:  Previous exam(s). FINDINGS: 3D Mammographic images were obtained following stereotactic guided biopsy of left breast calcifications and ultrasound-guided biopsy of a left breast mass the biopsy marking clips are in expected position at the sites of biopsy. IMPRESSION: 1. Appropriate positioning of the coil shaped biopsy marking clip at the site of biopsy in the upper-outer anterior left breast. 2. Appropriate positioning of the ribbon shaped biopsy marking clip at the site of biopsy in the upper-outer posterior left breast. Final Assessment: Post Procedure Mammograms for Marker Placement Electronically Signed   By: Ammie Ferrier M.D.   On: 03/01/2021 14:26  MM LT BREAST BX W LOC DEV 1ST LESION IMAGE BX SPEC STEREO GUIDE  Addendum Date: 03/05/2021   ADDENDUM REPORT: 03/05/2021  15:10 ADDENDUM: Pathology revealed DUCTAL CARCINOMA IN SITU-INTERMEDIATE NUCLEAR GRADE, CRIBRIFORM TYPE WITH NECROSIS NEGATIVE FOR INVASIVE CARCINOMA, BACKGROUND PROLIFERATIVE FIBROCYSTIC CHANGES, MICROCALCIFICATIONS PRESENT WITHIN DCIS AND BENIGN BREAST TISSUE of the LEFT breast, upper outer (coil clip). This was found to be concordant by Dr. Ammie Ferrier. Pathology revealed FIBROEPITHELIAL NEOPLASM, FAVOR FIBROADENOMA (SEE MICROSCOPIC COMMENT) ADJACENT FIBROCYSTIC CHANGES - NEGATIVE FOR MICROCALCIFICATIONS of the LEFT breast, 2 o'clock, 7cmfn ( ribbon clip). This was found to be concordant by Dr. Ammie Ferrier, with surgical consultation for excision recommended. Microscopic Comment: The left breast mass at 2:00 7 cm from the nipple showed lobulated nodular cores of breast with a fibrous and fibromyxoid stroma containing scattered angulated compressed branching ducts lined by a hyperplastic ductal epithelium. This stroma shows a variable but focally increased cellularity with periductal condensation. Although a fibroadenoma is favored a phyllodes tumor cannot be absolutely excluded. An excision is recommended. Pathology results were discussed with the patient by telephone with Bobetta Lime Bilingual Patient Services Representative. The patient reported doing well after the biopsies with tenderness at the sites. Post biopsy instructions and care were reviewed and questions were answered. The patient was encouraged to call [facility] for any additional concerns. Emotional support given. The patient was referred to [facility] on March 13, 2021. Patient contacted by Kathrine Haddock Bilingual Patient Services Representative on 03/05/2021 to give her recommended appointment for LEFT axillary ultrasound biopsy scheduled for 03/06/2021 @ 12:30 At the [facility] [place]. Pathology results  reported by Stacie Acres RN on 03/05/2021. Electronically Signed   By: Ammie Ferrier M.D.   On: 03/05/2021 15:10   Addendum Date: 03/04/2021   ADDENDUM REPORT: 03/04/2021 08:22 ADDENDUM: Correction to the exam section: The exam performed was a left stereotactic core needle biopsy. Electronically Signed   By: Ammie Ferrier M.D.   On: 03/04/2021 08:22   Result Date: 03/05/2021 CLINICAL DATA:  51 year old female presenting for stereotactic biopsy of left breast  calcifications. EXAM: RIGHT BREAST STEREOTACTIC CORE NEEDLE BIOPSY COMPARISON:  Previous exams. FINDINGS: The patient and I discussed the procedure of stereotactic-guided biopsy including benefits and alternatives. We discussed the high likelihood of a successful procedure. We discussed the risks of the procedure including infection, bleeding, tissue injury, clip migration, and inadequate sampling. Informed written consent was given. The usual time out protocol was performed immediately prior to the procedure. Using sterile technique and 1% Lidocaine as local anesthetic, under stereotactic guidance, a 9 gauge vacuum assisted device was used to perform core needle biopsy of calcifications in the upper-outer quadrant of the left breast using a superior approach. Specimen radiograph was performed showing calcifications in multiple core samples. Specimens with calcifications are identified for pathology. Lesion quadrant: Upper outer quadrant At the conclusion of the procedure, a coil shaped tissue marker clip was deployed into the biopsy cavity. Follow-up 2-view mammogram was performed and dictated separately. IMPRESSION: Stereotactic-guided biopsy of calcifications in the upper-outer left breast. No apparent complications. Electronically Signed: By: Ammie Ferrier M.D. On: 03/01/2021 14:13  [place] LT BREAST BX W LOC DEV 1ST LESION IMG BX SPEC US GUIDE  Addendum Date: 03/06/2021   ADDENDUM REPORT: 03/06/2021 08:03 ADDENDUM: Pathology revealed DUCTAL  CARCINOMA IN SITU-INTERMEDIATE NUCLEAR GRADE, CRIBRIFORM TYPE WITH NECROSIS NEGATIVE FOR INVASIVE CARCINOMA, BACKGROUND PROLIFERATIVE FIBROCYSTIC CHANGES, MICROCALCIFICATIONS PRESENT WITHIN DCIS AND BENIGN BREAST TISSUE of the LEFT breast, upper outer (coil clip). This was found to be concordant by Dr. Ammie Ferrier. Pathology revealed FIBROEPITHELIAL NEOPLASM, FAVOR FIBROADENOMA (SEE MICROSCOPIC COMMENT) ADJACENT FIBROCYSTIC CHANGES - NEGATIVE FOR MICROCALCIFICATIONS of the LEFT breast, 2 o'clock, 7cmfn ( ribbon clip). This was found to be concordant by Dr. Ammie Ferrier, with surgical consultation for excision recommended. Microscopic Comment: The left breast mass at 2:00 7 cm from the nipple showed lobulated nodular cores of breast with a fibrous and fibromyxoid stroma containing scattered angulated compressed branching ducts lined by a hyperplastic ductal epithelium. This stroma shows a variable but focally increased cellularity with periductal condensation. Although a fibroadenoma is favored a phyllodes tumor cannot be absolutely excluded. An excision is recommended. Pathology results were discussed with the patient by telephone with Bobetta Lime Bilingual Patient Services Representative. The patient reported doing well after the biopsies with tenderness at the sites. Post biopsy instructions and care were reviewed and questions were answered. The patient was encouraged to call [facility] for any additional concerns. Emotional support given. The patient was referred to [facility] on March 13, 2021. Patient contacted by Kathrine Haddock Bilingual Patient Services Representative on 03/05/2021 to give her recommended appointment for LEFT axillary ultrasound biopsy scheduled for 03/06/2021 @ 12:30 At the [facility] [place]. Pathology results reported by Stacie Acres RN on 03/05/2021.  Electronically Signed   By: Ammie Ferrier M.D.   On: 03/06/2021 08:03   Result Date: 03/06/2021 CLINICAL DATA:  51 year old female presenting for ultrasound-guided biopsy of a left breast mass. EXAM: ULTRASOUND GUIDED LEFT BREAST CORE NEEDLE BIOPSY COMPARISON:  Previous exam(s). PROCEDURE: I met with the patient and we discussed the procedure of ultrasound-guided biopsy, including benefits and alternatives. We discussed the high likelihood of a successful procedure. We discussed the risks of the procedure, including infection, bleeding, tissue injury, clip migration, and inadequate sampling. Informed written consent was given. The usual time-out protocol was performed immediately prior to the procedure. Lesion quadrant: Upper outer quadrant Using sterile technique and 1% Lidocaine as  local anesthetic, under direct ultrasound visualization, a 14 gauge spring-loaded device was used to perform biopsy of a mass in the left breast at 2 o'clock, 7 cm from the nipple using an inferior approach. At the conclusion of the procedure a ribbon shaped tissue marker clip was deployed into the biopsy cavity. Follow up 2 view mammogram was performed and dictated separately. IMPRESSION: Ultrasound guided biopsy of a left breast mass at 2 o'clock. No apparent complications. Electronically Signed: By: Ammie Ferrier M.D. On: 03/01/2021 14:14       IMPRESSION/PLAN: 1. Intermediate grade ER/PR positive DCIS of the left breast. Dr. Lisbeth Renshaw discusses the pathology findings and reviews the nature of early stage left breast disease. The consensus from the breast conference includes breast conservation with lumpectomy. Dr. Lisbeth Renshaw discusses the rationale for external radiotherapy to the breast  to reduce risks of local recurrence followed by antiestrogen therapy. We discussed the risks, benefits, short, and long term effects of radiotherapy, as well as the curative intent, and the patient is interested in proceeding. Dr. Lisbeth Renshaw  discusses the delivery and logistics of radiotherapy and anticipates a course of 4 weeks of radiotherapy to the left breast with deep inspiration breath hold technique. She lives in [place], but works in [place] so she elects to have therapy in [place]. We will see her back a few weeks after surgery to discuss the simulation process and anticipate we starting radiotherapy about 4-6 weeks after surgery.  2. Possible genetic predisposition to malignancy. The patient is a candidate for genetic testing given her personal history. She was offered referral and will meet genetics today.  3. Contraceptive Counseling. The patient has had tubal ligation performed and no pregnancy testing is needed prior to radiotherapy.   In a visit lasting 60 minutes, greater than 50% of the time was spent face to face reviewing her case, as well as in preparation of, discussing, and coordinating the patient's care.  The above documentation reflects my direct findings during this shared patient visit. Please see the separate note by Dr. Lisbeth Renshaw on this date for the remainder of the patient's plan of care.    Carola Rhine, [facility]    **Disclaimer: This note was dictated with voice recognition software. Similar sounding words can inadvertently be transcribed and this note may contain transcription errors which may not have been corrected upon publication of note.**

## 2021-03-11 NOTE — Progress Notes (Signed)
PLEASE WEAR A MASK OUT IN PUBLIC AND SOCIAL DISTANCE AND Glide YOUR HANDS FREQUENTLY. PLEASE ASK ALL YOUR CLOSE HOUSEHOLD CONTACT TO WEAR MASK OUT IN PUBLIC AND SOCIAL DISTANCE AND [place] HANDS FREQUENTLY ALSO.      Your procedure is scheduled on Tuesday, March 19, 2021.  Report to [facility] AT  6:30 am.   Call this number if you have problems the morning of surgery  :[phone].   OUR ADDRESS IS [address].  WE [facility] LOCATED IN [facility].  PLEASE BRING YOUR INSURANCE CARD AND PHOTO ID DAY OF SURGERY.  ONLY ONE PERSON ALLOWED IN FACILITY WAITING AREA.                                     REMEMBER:  DO NOT EAT FOOD, CANDY GUM OR MINTS  AFTER MIDNIGHT THE NIGHT BEFORE YOUR SURGERY . YOU MAY HAVE CLEAR LIQUIDS FROM MIDNIGHT THE NIGHT BEFORE YOUR SURGERY UNTIL  5:30 am. NO CLEAR LIQUIDS AFTER   5:30 am DAY OF SURGERY.   YOU MAY  BRUSH YOUR TEETH MORNING OF SURGERY AND RINSE YOUR MOUTH OUT, NO CHEWING GUM CANDY OR MINTS.    CLEAR LIQUID DIET   Foods Allowed                                                                     Foods Excluded  Coffee and tea, regular and decaf                             liquids that you cannot  Plain Jell-O any favor except red or purple                                           see through such as: Fruit ices (not with fruit pulp)                                     milk, soups, orange juice  Iced Popsicles                                    All solid food Carbonated beverages, regular and diet                                    Cranberry, grape and apple juices Sports drinks like Gatorade  Sample Menu Breakfast                                Lunch  Supper Cranberry juice                                           Jell-O                                     Grape juice                           Apple juice Coffee or tea                        Jell-O                                       Popsicle                                                Coffee or tea                        Coffee or tea  _____________________________________________________________________     TAKE THESE MEDICATIONS MORNING OF SURGERY WITH A SIP OF WATER:  Robaxin (methocarbamol) if needed  NO ALCOHOL FOR 24 HOURS BEFORE SURGERY  ONE VISITOR IS ALLOWED IN WAITING ROOM ONLY DAY OF SURGERY.  YOU MAY HAVE ANOTHER PERSON SWITCH OUT WITH THE  1  VISITOR IN THE WAITING ROOM DAY OF SURGERY AND A MASK MUST BE WORN IN THE WAITING ROOM.    2 VISITORS  MAY VISIT IN THE EXTENDED RECOVERY ROOM UNTIL 800 PM ONLY 1 VISITOR AGE 50 AND OVER MAY SPEND THE NIGHT AND MUST BE IN EXTENDED RECOVERY ROOM NO LATER THAN 800 PM .    UP TO 2 CHILDREN AGE 2 TO 15 MAY ALSO VISIT IN EXTENDED RECOV ERY ROOM ONLY UNTIL 800 PM AND MUST LEAVE BY 800 PM.   ALL PERSONS VISITING IN EXTENDED RECOVERY ROOM MUST WEAR A MASK.                                    DO NOT WEAR JEWERLY, MAKE UP. DO NOT WEAR LOTIONS, POWDERS, PERFUMES OR NAIL POLISH ON YOUR FINGERNAILS. TOENAIL POLISH IS OK TO WEAR. DO NOT SHAVE FOR 48 HOURS PRIOR TO DAY OF SURGERY. MEN MAY SHAVE FACE AND NECK. CONTACTS, GLASSES, OR DENTURES MAY NOT BE WORN TO SURGERY.                                    [facility] IS NOT RESPONSIBLE  FOR ANY BELONGINGS.                                                                    Marland Kitchen  [facility] - Preparing for Surgery Before surgery, you can play an important role.  Because skin is not sterile, your skin needs to be as free of germs as possible.  You can reduce the number of germs on your skin by washing with CHG (chlorahexidine gluconate) soap before surgery.  CHG is an antiseptic cleaner which kills germs and bonds with the skin to continue killing germs even after washing. Please DO NOT use if you have an allergy to CHG or antibacterial soaps.  If your skin becomes reddened/irritated stop using the CHG and inform  your nurse when you arrive at Short Stay. Do not shave (including legs and underarms) for at least 48 hours prior to the first CHG shower.  You may shave your face/neck. Please follow these instructions carefully:  1.  Shower with CHG Soap the night before surgery and the  morning of Surgery.  2.  If you choose to wash your hair, wash your hair first as usual with your  normal  shampoo.  3.  After you shampoo, rinse your hair and body thoroughly to remove the  shampoo.                            4.  Use CHG as you would any other liquid soap.  You can apply chg directly  to the skin and wash                      Gently with a scrungie or clean washcloth.  5.  Apply the CHG Soap to your body ONLY FROM THE NECK DOWN.   Do not use on face/ open                           Wound or open sores. Avoid contact with eyes, ears mouth and genitals (private parts).                       Wash face,  Genitals (private parts) with your normal soap.             6.  Wash thoroughly, paying special attention to the area where your surgery  will be performed.  7.  Thoroughly rinse your body with warm water from the neck down.  8.  DO NOT shower/wash with your normal soap after using and rinsing off  the CHG Soap.                9.  Pat yourself dry with a clean towel.            10.  Wear clean pajamas.            11.  Place clean sheets on your bed the night of your first shower and do not  sleep with pets. Day of Surgery : Do not apply any lotions/deodorants the morning of surgery.  Please wear clean clothes to the hospital/surgery center.  IF YOU HAVE ANY SKIN IRRITATION OR PROBLEMS WITH THE SURGICAL SOAP, PLEASE GET A BAR OF GOLD DIAL SOAP AND SHOWER THE NIGHT BEFORE YOUR SURGERY AND THE MORNING OF YOUR SURGERY. PLEASE LET THE NURSE KNOW MORNING OF YOUR SURGERY IF YOU HAD ANY PROBLEMS WITH THE SURGICAL SOAP.   ________________________________________________________________________  QUESTIONS CALL Yvonna Brun PRE OP NURSE PHONE [phone].

## 2021-03-11 NOTE — Progress Notes (Addendum)
Spoke w/ via phone for pre-op interview---Spoke with patient via [employer], [id number] through [employer]. Lab needs dos----urine pregnancy POCT per anesthesia, surgery orders pending as of 03/11/21.             Lab results------Lap appt 03/14/21 for CBC, type & screen COVID test -----patient states asymptomatic no test needed Arrive at -------0630 NPO after MN NO Solid Food.  Clear liquids from MN until---0530 Med rec completed Medications to take morning of surgery -----Robaxin (methocarbamol) if needed Diabetic medication -----n/a Patient instructed no nail polish to be worn day of surgery Patient instructed to bring photo id and insurance card day of surgery Patient aware to have Driver (ride ) / caregiver    for 24 hours after surgery - husband Patient Special Instructions -----Extended recovery instructions given. Pre-Op special Istructions -----Requested orders from Dr. Dellis Filbert via Epic IB on 03/07/21. Patient verbalized understanding of instructions that were given at this phone interview. Patient denies shortness of breath, chest pain, fever, cough at this phone interview.   Spanish interpreter requested for day of surgery.

## 2021-03-12 NOTE — Progress Notes (Signed)
?[facility] ?CONSULT NOTE ? ?Patient Care Team: ?[name], MD as PCP - General (Obstetrics and Gynecology) ?Rockwell Germany, RN as Oncology Nurse Navigator ?Mauro Kaufmann, RN as Oncology Nurse Navigator ?Coralie Keens, MD as Consulting Physician (General Surgery) ?Nicholas Lose, MD as Consulting Physician (Hematology and Oncology) ?Kyung Rudd, MD as Consulting Physician (Radiation Oncology) ? ?CHIEF COMPLAINTS/PURPOSE OF CONSULTATION:  ?Newly diagnosed breast cancer ? ?HISTORY OF PRESENTING ILLNESS:  ?Helen Mccarthy 51 y.o. female is here because of recent diagnosis of DCIS of the left breast. Diagnostic mammogram and [place] on 02/22/2021 showed indeterminate 1.1 cm group of calcifications in the upper-outer left breast, indeterminate 1.7 cm mass in the left breast at 2 o'clock, and 1 borderline enlarged lymph node in the left axilla with cortex measuring just over 3 mm. Left breast biopsy on 03/01/2021 showed intermediate grade DCIS, cribriform with calcifications, ER+(95%)/PR+(100%). Left axillary lymph node biopsy on 03/06/2021 showed no evidence of malignancy. She presents to the clinic today for initial evaluation and discussion of treatment options.  ? ?I reviewed her records extensively and collaborated the history with the patient. ? ?SUMMARY OF ONCOLOGIC HISTORY: ?Oncology History  ?Ductal carcinoma in situ (DCIS) of left breast  ?03/06/2021 Initial Diagnosis  ? Left breast calcifications and diagnostic mammogram: 1.1 cm, additional mass at 2:00: 1.7 cm (fibroadenoma).  The calcifications and biopsy came back intermediate grade DCIS ER 95% PR 100%, 1 axillary lymph node was biopsied and was benign concordant ?  ? ? ?MEDICAL HISTORY:  ?Past Medical History:  ?Diagnosis Date  ? Anemia   ? heavy menstrual bleeding and fibroids  ? Breast cancer ([place]) 02/2021  ? ductal carcinoma in situ left breast  ? Elevated TSH   ? Endometrial mass   ? History of PID   ? Hyperlipidemia    ? Menorrhagia   ? Uterine fibroid   ? ? ?SURGICAL HISTORY: ?Past Surgical History:  ?Procedure Laterality Date  ? BREAST BIOPSY Left 03/01/2021  ? BREAST BIOPSY Left 03/06/2021  ? CESAREAN SECTION    ? X4  ? DILATATION & CURETTAGE/HYSTEROSCOPY WITH MYOSURE N/A 08/28/2017  ? Procedure: [place];  Surgeon: [name], MD;  Location: [facility];  Service: Gynecology;  Laterality: N/A;  requests 7:30am OR time in [place] block ?requests XL Myosure  ?requests one hour OR time  ? KNEE SURGERY Right   ? SHOULDER SURGERY Right   ? TUBAL LIGATION    ? ? ?SOCIAL HISTORY: ?Social History  ? ?Socioeconomic History  ? Marital status: Married  ?  Spouse name: Not on file  ? Number of children: Not on file  ? Years of education: Not on file  ? Highest education level: Not on file  ?Occupational History  ? Not on file  ?Tobacco Use  ? Smoking status: Never  ? Smokeless tobacco: Never  ?Vaping Use  ? Vaping Use: Never used  ?Substance and Sexual Activity  ? Alcohol use: Yes  ?  Comment: occasionally  ? Drug use: Never  ? Sexual activity: Yes  ?  Partners: Male  ?  Birth control/protection: Surgical  ?  Comment: 1st intercourse- 48, partners- 39, married- 83 yrs , btl  ?Other Topics Concern  ? Not on file  ?Social History Narrative  ? Not on file  ? ?Social Determinants of Health  ? ?Financial Resource Strain: Not on file  ?Food Insecurity: Not on file  ?Transportation Needs: Not on file  ?  Physical Activity: Not on file  ?Stress: Not on file  ?Social Connections: Not on file  ?Intimate Partner Violence: Not on file  ? ? ?FAMILY HISTORY: ?Family History  ?Problem Relation Age of Onset  ? Hypertension Mother   ? Hypertension Father   ? Diabetes Paternal Aunt   ? Diabetes Paternal Uncle   ? ? ?ALLERGIES:  has No Known Allergies. ? ?MEDICATIONS:  ?Current Outpatient Medications  ?Medication Sig Dispense Refill  ? Ascorbic Acid (VITAMIN C PO) Take by mouth.    ?  calcium carbonate (OSCAL) 1500 (600 Ca) MG TABS tablet Take by mouth 2 (two) times daily with a meal.    ? ferrous sulfate 325 (65 FE) MG tablet Take 325 mg by mouth daily with breakfast. Ran out a month ago per pt on 03/11/21.    ? methocarbamol (ROBAXIN) 500 MG tablet Take 500 mg by mouth every 6 (six) hours as needed.    ? Multiple Vitamin (MULTIVITAMIN) tablet Take 1 tablet by mouth daily.    ? Omega-3 1400 MG CAPS Take by mouth.    ? oxyCODONE (OXY IR/ROXICODONE) 5 MG immediate release tablet Take 5 mg by mouth every 6 (six) hours as needed.    ? VITAMIN D PO Take by mouth.    ? ?No current facility-administered medications for this visit.  ? ? ?REVIEW OF SYSTEMS:   ?Constitutional: Denies fevers, chills or abnormal night sweats ?  ?All other systems were reviewed with the patient and are negative. ? ?PHYSICAL EXAMINATION: ?ECOG PERFORMANCE STATUS: 1 - Symptomatic but completely ambulatory ? ?Vitals:  ? 03/13/21 0852  ?BP: (!) 153/69  ?Pulse: 71  ?Resp: 18  ?Temp: (!) 97.5 ?F (36.4 ?C)  ?SpO2: 99%  ? ?Filed Weights  ? 03/13/21 0852  ?Weight: 153 lb (69.4 kg)  ? ?  ? ?LABORATORY DATA:  ?I have reviewed the data as listed ?Lab Results  ?Component Value Date  ? WBC 19.0 (H) 03/13/2021  ? HGB 13.1 03/13/2021  ? HCT 39.4 03/13/2021  ? MCV 73.5 (L) 03/13/2021  ? PLT 412 (H) 03/13/2021  ? ?Lab Results  ?Component Value Date  ? NA 137 03/13/2021  ? K 4.0 03/13/2021  ? CL 106 03/13/2021  ? CO2 23 03/13/2021  ? ? ?RADIOGRAPHIC STUDIES: ?I have personally reviewed the radiological reports and agreed with the findings in the report. ? ?ASSESSMENT AND PLAN:  ?Ductal carcinoma in situ (DCIS) of left breast ?03/06/2021:Left breast calcifications and diagnostic mammogram: 1.1 cm, additional mass at 2:00: 1.7 cm (fibroadenoma).  The calcifications and biopsy came back intermediate grade DCIS ER 95% PR 100%, 1 axillary lymph node was biopsied and was benign concordant ? ?Pathology review: I discussed with the patient the  difference between DCIS and invasive breast cancer. It is considered a precancerous lesion. DCIS is classified as a 0. It is generally detected through mammograms as calcifications. We discussed the significance of grades and its impact on prognosis. We also discussed the importance of ER and PR receptors and their implications to adjuvant treatment options. Prognosis of DCIS dependence on grade, comedo necrosis. It is anticipated that if not treated, 20-30% of DCIS can develop into invasive breast cancer. ? ?Recommendation: ?1. Breast conserving surgery ?2. Followed by adjuvant radiation therapy ?3. Followed by antiestrogen therapy with tamoxifen ?5 years ?Genetic testing ? ?Tamoxifen counseling: We discussed the risks and benefits of tamoxifen. These include but not limited to insomnia, hot flashes, mood changes, vaginal dryness, and weight gain. Although rare,  serious side effects including endometrial cancer, risk of blood clots were also discussed. We strongly believe that the benefits far outweigh the risks. Patient understands these risks and consented to starting treatment. Planned treatment duration is 5 years. ? ?Return to clinic after surgery to discuss the final pathology report and come up with an adjuvant treatment plan. ? ?The above entire discussion was facilitated by a Spanish interpreter ? ?All questions were answered. The patient knows to call the clinic with any problems, questions or concerns. ?  ?Rulon Eisenmenger, MD, MPH ?03/13/2021  ? ? I, Thana Ates, am acting as scribe for Nicholas Lose, MD. ? ?I have reviewed the above documentation for accuracy and completeness, and I agree with the above. ? ? ? ? ?

## 2021-03-13 ENCOUNTER — Other Ambulatory Visit: Payer: Self-pay

## 2021-03-13 ENCOUNTER — Encounter: Payer: Self-pay | Admitting: Genetic Counselor

## 2021-03-13 ENCOUNTER — Inpatient Hospital Stay (HOSPITAL_BASED_OUTPATIENT_CLINIC_OR_DEPARTMENT_OTHER): Payer: Self-pay | Admitting: Hematology and Oncology

## 2021-03-13 ENCOUNTER — Other Ambulatory Visit: Payer: Self-pay | Admitting: Surgery

## 2021-03-13 ENCOUNTER — Encounter: Payer: Self-pay | Admitting: *Deleted

## 2021-03-13 ENCOUNTER — Inpatient Hospital Stay (HOSPITAL_BASED_OUTPATIENT_CLINIC_OR_DEPARTMENT_OTHER): Payer: Self-pay | Admitting: Genetic Counselor

## 2021-03-13 ENCOUNTER — Ambulatory Visit
Admission: RE | Admit: 2021-03-13 | Discharge: 2021-03-13 | Disposition: A | Discharge status: Home / Self Care | Payer: BC Managed Care – PPO | Source: Ambulatory Visit | Attending: Radiation Oncology | Admitting: Radiation Oncology

## 2021-03-13 ENCOUNTER — Ambulatory Visit: Payer: BC Managed Care – PPO | Admitting: Physical Therapy

## 2021-03-13 ENCOUNTER — Inpatient Hospital Stay: Payer: Self-pay | Admitting: Licensed Clinical Social Worker

## 2021-03-13 ENCOUNTER — Inpatient Hospital Stay: Payer: Self-pay | Attending: Hematology and Oncology

## 2021-03-13 DIAGNOSIS — D0512 Intraductal carcinoma in situ of left breast: Secondary | ICD-10-CM

## 2021-03-13 DIAGNOSIS — N920 Excessive and frequent menstruation with regular cycle: Secondary | ICD-10-CM | POA: Insufficient documentation

## 2021-03-13 DIAGNOSIS — D259 Leiomyoma of uterus, unspecified: Secondary | ICD-10-CM | POA: Insufficient documentation

## 2021-03-13 DIAGNOSIS — Z803 Family history of malignant neoplasm of breast: Secondary | ICD-10-CM

## 2021-03-13 DIAGNOSIS — D649 Anemia, unspecified: Secondary | ICD-10-CM | POA: Insufficient documentation

## 2021-03-13 DIAGNOSIS — Z79899 Other long term (current) drug therapy: Secondary | ICD-10-CM | POA: Insufficient documentation

## 2021-03-13 DIAGNOSIS — E785 Hyperlipidemia, unspecified: Secondary | ICD-10-CM | POA: Insufficient documentation

## 2021-03-13 DIAGNOSIS — Z170 Estrogen receptor positive status [ER+]: Secondary | ICD-10-CM | POA: Insufficient documentation

## 2021-03-13 HISTORY — DX: Family history of malignant neoplasm of breast: Z80.3

## 2021-03-13 LAB — CBC WITH DIFFERENTIAL (CANCER CENTER ONLY)
Abs Immature Granulocytes: 0 10*3/uL (ref 0.00–0.07)
Basophils Absolute: 0 10*3/uL (ref 0.0–0.1)
Basophils Relative: 0 %
Eosinophils Absolute: 0 10*3/uL (ref 0.0–0.5)
Eosinophils Relative: 0 %
HCT: 39.4 % (ref 36.0–46.0)
Hemoglobin: 13.1 g/dL (ref 12.0–15.0)
Lymphocytes Relative: 10 %
Lymphs Abs: 1.9 10*3/uL (ref 0.7–4.0)
MCH: 24.4 pg — ABNORMAL LOW (ref 26.0–34.0)
MCHC: 33.2 g/dL (ref 30.0–36.0)
MCV: 73.5 fL — ABNORMAL LOW (ref 80.0–100.0)
Monocytes Absolute: 1.1 10*3/uL — ABNORMAL HIGH (ref 0.1–1.0)
Monocytes Relative: 6 %
Neutro Abs: 16 10*3/uL — ABNORMAL HIGH (ref 1.7–7.7)
Neutrophils Relative %: 84 %
Platelet Count: 412 10*3/uL — ABNORMAL HIGH (ref 150–400)
RBC: 5.36 MIL/uL — ABNORMAL HIGH (ref 3.87–5.11)
RDW: 15.5 % (ref 11.5–15.5)
WBC Count: 19 10*3/uL — ABNORMAL HIGH (ref 4.0–10.5)
nRBC: 0 % (ref 0.0–0.2)

## 2021-03-13 LAB — CMP (CANCER CENTER ONLY)
ALT: 18 U/L (ref 0–44)
AST: 22 U/L (ref 15–41)
Albumin: 4 g/dL (ref 3.5–5.0)
Alkaline Phosphatase: 89 U/L (ref 38–126)
Anion gap: 8 (ref 5–15)
BUN: 13 mg/dL (ref 6–20)
CO2: 23 mmol/L (ref 22–32)
Calcium: 10 mg/dL (ref 8.9–10.3)
Chloride: 106 mmol/L (ref 98–111)
Creatinine: 0.74 mg/dL (ref 0.44–1.00)
GFR, Estimated: >60 mL/min (ref >60)
Glucose, Bld: 112 mg/dL — ABNORMAL HIGH (ref 70–99)
Potassium: 4 mmol/L (ref 3.5–5.1)
Sodium: 137 mmol/L (ref 135–145)
Total Bilirubin: 0.4 mg/dL (ref 0.3–1.2)
Total Protein: 8.2 g/dL — ABNORMAL HIGH (ref 6.5–8.1)

## 2021-03-13 LAB — GENETIC SCREENING ORDER

## 2021-03-13 NOTE — Progress Notes (Signed)
[facility] Clinical Social Work  ?Initial Assessment ? ? ?Helen Mccarthy is a 51 y.o. year old female accompanied by daughter and Spanish interpreter. Clinical Social Work was referred by  [facility]  for assessment of psychosocial needs.  ? ?SDOH (Social Determinants of Health) assessments performed: Yes ?SDOH Interventions   ? ?Flowsheet Row Most Recent Value  ?SDOH Interventions   ?Food Insecurity Interventions Other (Comment)  [food pantry bag]  ?Financial Strain Interventions Development worker, community, Other (Comment)  [breast cancer foundations]  ?Transportation Interventions Intervention Not Indicated  ? ?  ?  ?Distress Screen completed: Yes ?ONCBCN DISTRESS SCREENING 03/13/2021  ?Screening Type Initial Screening  ?Distress experienced in past week (1-10) 4  ?Spiritual/Religous concerns type Relating to God  ? ? ? ? ?Family/Social Information:  ?Housing Arrangement: patient lives with husband, two daughters ?Family members/support persons in your life? Family ?Transportation concerns: no  ?Employment: Unemployed. Income source: husband works but there is limited income ?Financial concerns: Yes, current concerns ?Type of concern: Utilities, Medical bills, and Food ?Food access concerns: yes, cost ?Religious or spiritual practice: not addressed ?Services Currently in place:  none ? ?Coping/ Adjustment to diagnosis: ?Patient understands treatment plan and what happens next? yes ?Concerns about diagnosis and/or treatment: How I will pay for the services I need ?Patient reported stressors: Finances and Relating to God ?Current coping skills/ strengths: Supportive family/friends  ? ? ? SUMMARY: ?Current SDOH Barriers:  ?Financial constraints related to medical concerns ? ?Clinical Social Work Clinical Goal(s):  ?patient will work with SW to address concerns related to financial strain ? ?Interventions: ?Discussed common feeling and emotions when being diagnosed with cancer, and the importance of support during  treatment ?Informed patient of the support team roles and support services at [facility] ?Provided CSW contact information and encouraged patient to call with any questions or concerns ?Submitted application for [facility] ?Provided bag of food and household goods from food pantry ? ? ?Follow Up Plan:  CSW will mail additional information on resources in [place] to patient. ?Patient verbalizes understanding of plan: Yes ? ? ? ?Natividad Schlosser E Amerah Puleo, LCSW ?

## 2021-03-13 NOTE — Assessment & Plan Note (Signed)
03/06/2021:Left breast calcifications and diagnostic mammogram: 1.1 cm, additional mass at 2:00: 1.7 cm (fibroadenoma).  The calcifications and biopsy came back intermediate grade DCIS ER 95% PR 100%, 1 axillary lymph node was biopsied and was benign concordant ? ?Pathology review: I discussed with the patient the difference between DCIS and invasive breast cancer. It is considered a precancerous lesion. DCIS is classified as a 0. It is generally detected through mammograms as calcifications. We discussed the significance of grades and its impact on prognosis. We also discussed the importance of ER and PR receptors and their implications to adjuvant treatment options. Prognosis of DCIS dependence on grade, comedo necrosis. It is anticipated that if not treated, 20-30% of DCIS can develop into invasive breast cancer. ? ?Recommendation: ?1. Breast conserving surgery ?2. Followed by adjuvant radiation therapy ?3. Followed by antiestrogen therapy with tamoxifen ?5 years ?Genetic testing ? ?Tamoxifen counseling: We discussed the risks and benefits of tamoxifen. These include but not limited to insomnia, hot flashes, mood changes, vaginal dryness, and weight gain. Although rare, serious side effects including endometrial cancer, risk of blood clots were also discussed. We strongly believe that the benefits far outweigh the risks. Patient understands these risks and consented to starting treatment. Planned treatment duration is 5 years. ? ?Return to clinic after surgery to discuss the final pathology report and come up with an adjuvant treatment plan. ? ? ?

## 2021-03-13 NOTE — Addendum Note (Signed)
Encounter addended by: Hayden Pedro, PA-C on: 03/13/2021 11:21 AM ? Actions taken: Clinical Note Signed

## 2021-03-13 NOTE — Progress Notes (Signed)
REFERRING PROVIDER: ?Nicholas Lose, MD ?[address] ?[place],  [place] [address] ? ?PRIMARY PROVIDER:  ?[name], MD ? ?PRIMARY REASON FOR VISIT:  ?1. Ductal carcinoma in situ (DCIS) of left breast   ?2. Family history of breast cancer   ? ? ?HISTORY OF PRESENT ILLNESS:   ?Helen Mccarthy, a 51 y.o. female, was seen for a [place] cancer genetics consultation during the breast multidisciplinary clinic at the request of Dr. Lindi Adie due to a personal history of cancer.  Helen Mccarthy presents to clinic today to discuss the possibility of a hereditary predisposition to cancer, to discuss genetic testing, and to further clarify her future cancer risks, as well as potential cancer risks for family members.  ? ?In February 2023, at the age of 70, Helen Mccarthy was diagnosed with ductal carcinoma in situ of the left breast. The treatment plan is pending.  ? ?CANCER HISTORY:  ?Oncology History  ?Ductal carcinoma in situ (DCIS) of left breast  ?03/06/2021 Initial Diagnosis  ? Left breast calcifications and diagnostic mammogram: 1.1 cm, additional mass at 2:00: 1.7 cm (fibroadenoma).  The calcifications and biopsy came back intermediate grade DCIS ER 95% PR 100%, 1 axillary lymph node was biopsied and was benign concordant ?  ?03/13/2021 Cancer Staging  ? Staging form: Breast, AJCC 8th Edition ?- Clinical: Stage 0 (cTis (DCIS), cN0, cM0, G2, ER+, PR+, HER2: Not Assessed) - Signed by Nicholas Lose, MD on 03/13/2021 ?Stage prefix: Initial diagnosis ?Histologic grading system: 3 grade system ? ?  ? ? ? ?Past Medical History:  ?Diagnosis Date  ? Anemia   ? heavy menstrual bleeding and fibroids  ? Breast cancer ([place]) 02/2021  ? ductal carcinoma in situ left breast  ? Elevated TSH   ? Endometrial mass   ? Family history of breast cancer 03/13/2021  ? History of PID   ? Hyperlipidemia   ? Menorrhagia   ? Uterine fibroid   ? ? ?Past Surgical History:  ?Procedure Laterality Date  ? BREAST BIOPSY  Left 03/01/2021  ? BREAST BIOPSY Left 03/06/2021  ? CESAREAN SECTION    ? X4  ? DILATATION & CURETTAGE/HYSTEROSCOPY WITH MYOSURE N/A 08/28/2017  ? Procedure: [place];  Surgeon: [name], MD;  Location: [facility];  Service: Gynecology;  Laterality: N/A;  requests 7:30am OR time in [place] block ?requests XL Myosure  ?requests one hour OR time  ? KNEE SURGERY Right   ? SHOULDER SURGERY Right   ? TUBAL LIGATION    ? ? ? ?FAMILY HISTORY:  ?We obtained a detailed, 4-generation family history.  Significant diagnoses are listed below: ?Family History  ?Problem Relation Age of Onset  ? Breast cancer Maternal Aunt   ?     dx after 43  ? ? ? ?Helen Mccarthy is unaware of previous family history of genetic testing for hereditary cancer risks. Patient's maternal ancestors are of [place] descent, and paternal ancestors are of [place] descent. There is no reported Ashkenazi Jewish ancestry. There is no known consanguinity. ? ?GENETIC COUNSELING ASSESSMENT: Helen Mccarthy is a 51 y.o. female with a personal and family history of cancer which is somewhat suggestive of a hereditary cancer syndrome and predisposition to cancer given her age of diagnosis and the presence of related cancers in the family. We, therefore, discussed and recommended the following at today's visit.  ? ?DISCUSSION: We discussed that 5 - 10% of cancer is hereditary, with most  cases of hereditary breast cancer associated with mutations in BRCA1/2.  There are other genes that can be associated with hereditary breast cancer syndromes.  Type of cancer risk and level of risk are gene-specific. We discussed that testing is beneficial for several reasons including knowing how to follow individuals after completing their treatment, identifying whether potential treatment options would be beneficial, and understanding if other family members could be at risk for cancer and  allowing them to undergo genetic testing.  ? ?We reviewed the characteristics, features and inheritance patterns of hereditary cancer syndromes. We also discussed genetic testing, including the appropriate family members to test, the process of testing, insurance coverage and turn-around-time for results. We discussed the implications of a negative, positive and/or variant of uncertain significant result. In order to get genetic test results in a timely manner so that Helen Mccarthy can use these genetic test results for surgical decisions, we recommended Helen Mccarthy pursue genetic testing for the [employer].  The BRCAplus panel offered by [employer] and includes sequencing and deletion/duplication analysis for the following 8 genes: ATM, BRCA1, BRCA2, CDH1, CHEK2, PALB2, PTEN, and TP53. ? Once complete, we recommend Helen Mccarthy pursue reflex genetic testing to a more comprehensive gene panel.  ? ?The CustomNext-Cancer+RNAinsight panel offered by [facility] includes sequencing and rearrangement analysis for the following 47 genes:  APC, ATM, AXIN2, BARD1, BMPR1A, BRCA1, BRCA2, BRIP1, CDH1, CDK4, CDKN2A, CHEK2, DICER1, EPCAM, GREM1, HOXB13, MEN1, MLH1, MSH2, MSH3, MSH6, MUTYH, NBN, NF1, NF2, NTHL1, PALB2, PMS2, POLD1, POLE, PTEN, RAD51C, RAD51D, RECQL, RET, SDHA, SDHAF2, SDHB, SDHC, SDHD, SMAD4, SMARCA4, STK11, TP53, TSC1, TSC2, and VHL.  RNA data is routinely analyzed for use in variant interpretation for all genes. ? ?Based on Helen Mccarthy's personal and family history of cancer, she meets medical criteria for genetic testing. Despite that she meets criteria, she may still have an out of pocket cost. We discussed that if her out of pocket cost for testing is over $100, the laboratory should contact them to discuss self-pay prices, patient pay assistance programs, if applicable, and other billing options.  ? ?PLAN: After considering the risks, benefits, and  limitations, Helen Mccarthy provided informed consent to pursue genetic testing and the blood sample was sent to [facility] for analysis of the BRCAPlus and CustomNext-Cancer +RNAinsight. Results should be available within approximately 1-2 weeks' time, at which point they will be disclosed by telephone to Helen Mccarthy, as will any additional recommendations warranted by these results. Helen Mccarthy will receive a summary of her genetic counseling visit and a copy of her results once available. This information will also be available in Epic.  ? ?Lastly, we encouraged Helen Mccarthy to remain in contact with cancer genetics annually so that we can continuously update the family history and inform her of any changes in cancer genetics and testing that may be of benefit for this family.  ? ?Helen Mccarthy's questions were answered to her satisfaction today. Our contact information was provided should additional questions or concerns arise. Thank you for the referral and allowing [place] to share in the care of your patient.  ? ?Gared Gillie M. Joette Catching, [place], [place] ?Genetic Counselor ?[email or web address] ?(P) [phone] ? ?The patient was seen for a total of 30 minutes in face-to-face genetic counseling.  The patient was accompanied by a spanish interpreter and her daughter. Drs. Magrinat, Lindi Adie and/or Burr Medico were available to discuss this case as needed.  ?_______________________________________________________________________ ?For Office  Staff:  ?Number of people involved in session: 2 ?Was an Intern/ student involved with case: no ? ?

## 2021-03-14 ENCOUNTER — Telehealth: Payer: Self-pay | Admitting: *Deleted

## 2021-03-14 ENCOUNTER — Encounter (HOSPITAL_COMMUNITY)
Admission: RE | Admit: 2021-03-14 | Discharge: 2021-03-14 | Disposition: A | Discharge status: Home / Self Care | Payer: Self-pay | Source: Ambulatory Visit | Attending: Obstetrics & Gynecology | Admitting: Obstetrics & Gynecology

## 2021-03-14 ENCOUNTER — Ambulatory Visit (INDEPENDENT_AMBULATORY_CARE_PROVIDER_SITE_OTHER): Payer: Self-pay | Admitting: Obstetrics & Gynecology

## 2021-03-14 ENCOUNTER — Encounter: Payer: Self-pay | Admitting: Obstetrics & Gynecology

## 2021-03-14 VITALS — BP 114/70 | HR 76 | Resp 16 | Ht 58.25 in | Wt 153.0 lb

## 2021-03-14 DIAGNOSIS — Z01818 Encounter for other preprocedural examination: Secondary | ICD-10-CM

## 2021-03-14 DIAGNOSIS — Z01812 Encounter for preprocedural laboratory examination: Secondary | ICD-10-CM | POA: Insufficient documentation

## 2021-03-14 DIAGNOSIS — D72828 Other elevated white blood cell count: Secondary | ICD-10-CM

## 2021-03-14 DIAGNOSIS — D0512 Intraductal carcinoma in situ of left breast: Secondary | ICD-10-CM

## 2021-03-14 DIAGNOSIS — R1031 Right lower quadrant pain: Secondary | ICD-10-CM

## 2021-03-14 DIAGNOSIS — D219 Benign neoplasm of connective and other soft tissue, unspecified: Secondary | ICD-10-CM

## 2021-03-14 LAB — CBC
HCT: 40.6 % (ref 36.0–46.0)
Hemoglobin: 13.2 g/dL (ref 12.0–15.0)
MCH: 24.7 pg — ABNORMAL LOW (ref 26.0–34.0)
MCHC: 32.5 g/dL (ref 30.0–36.0)
MCV: 75.9 fL — ABNORMAL LOW (ref 80.0–100.0)
Platelets: 417 10*3/uL — ABNORMAL HIGH (ref 150–400)
RBC: 5.35 MIL/uL — ABNORMAL HIGH (ref 3.87–5.11)
RDW: 16.1 % — ABNORMAL HIGH (ref 11.5–15.5)
WBC: 15.8 10*3/uL — ABNORMAL HIGH (ref 4.0–10.5)
nRBC: 0 % (ref 0.0–0.2)

## 2021-03-14 NOTE — Telephone Encounter (Signed)
Spoke with [employer] in [employer].  ?Surgery updated to TAH/ BSO.  ? ?Routing to Dr. Ileene Musa.  ? ?Cc: Hayley Carder ? ?Encounter closed.  ?

## 2021-03-14 NOTE — Telephone Encounter (Signed)
-----   Message from [name], MD sent at 03/14/2021 10:06 AM EST ----- ?Regarding: Need to add Bilateral Salpingo-Oophorectomy to her planned surgery on 3/7th ?Recent Dx of Left Breast Ca with Estrogen and Progesterone Receptors positive.  Patient needs a bilateral Oophorectomy. ? ?

## 2021-03-14 NOTE — Progress Notes (Addendum)
Sent secure chat to jill hamm RN and dr Dellis Filbert,  white blood cell count 15.8 with labs today and white blood cell count was 19 with labs at cancer center yesterday. ? ?Addendum: Received secure chat from [place] dr Dellis Filbert wishes to have cbc repeated day of surgery order entered in epic. ?

## 2021-03-14 NOTE — Progress Notes (Signed)
? ? ?Helen Mccarthy 07/20/1970 [id number] ? ? ?     51 y.o.  G4P4L4  ? ?RP: Preop TAH/Bilateral Salpingectomy ? ?HPI: No change in Sxs x last visit.  Uncomfortable on the right lower quadrant, especially when sitting.  Menses every month with heavy flow.  Pelvic US 02/12/2021 showed very large Fibroids.  Recent Dx of Left Breast Cancer with ER and PR positive.  Will therefore add a bilateral Oophorectomy to her surgery.   ? ? ?OB History  ?Gravida Para Term Preterm AB Living  ?4 4 4     4   ?SAB IAB Ectopic Multiple Live Births  ?        4  ?  ?# Outcome Date GA Lbr Len/2nd Weight Sex Delivery Anes PTL Lv  ?4 Term     F CS-Unspec  N LIV  ?3 Term     F CS-Unspec  N LIV  ?2 Term     M CS-Unspec  N LIV  ?1 Term     M CS-Unspec  N LIV  ?   Birth Comments: DECEASED THREE DAYS AFTER BEING BORN-   ? ? ?Past medical history,surgical history, problem list, medications, allergies, family history and social history were all reviewed and documented in the EPIC chart. ? ? ?Directed ROS with pertinent positives and negatives documented in the history of present illness/assessment and plan. ? ?Exam: ? ?Vitals:  ? 03/14/21 0929  ?BP: 114/70  ?Pulse: 76  ?Resp: 16  ?Weight: 153 lb (69.4 kg)  ?Height: 4' 10.25" (1.48 m)  ? ?General appearance:  Normal ? ?Pelvic US 02/12/2021:  Comparison is made with previous scan in 2019.  Transabdominal scan.  Grossly enlarged uterus with large fibroid to the right fundus measured at more than 12 x 8 cm. Both uterus and fibroids are increased in size since previous scan.  The large fibroid is very tender to palpation.  3 other smaller fibroids are present to the left fundus.  The overall uterine size is measured at 18.73 x 13.66 x 10.5 cm.  The volume is estimated at 1406.6 cm cube.  The endometrial lining is symmetrical measured at 7.8 mm with no thickening or mass seen.  Both ovaries are identified and normal in size with sparse follicles.  No free fluid in the pelvis. ?  ?   ?Assessment/Plan:  51 y.o. [id number]  ?  ?1. Right lower quadrant pain ?Right lower quadrant abdominal pain most likely due to the large tender fibroid at the right uterine side.  Given the increase in size since the last pelvic ultrasound with the fact that the fibroids are very large and tender with right pelvic pain with heavy regular menses at age 76, the decision was made to proceed with a total abdominal hysterectomy with bilateral salpingectomy, now with the recent Dx of Breast Ca with ER and PR Positive, a bilateral Oophorectomy will also be performed.  Patient agrees with the plan.  Preop preparation, Surgery and Risks, as well as Postop precautions and expectations were thoroughly reviewed and discussed. ? ?2. Right lower quadrant pain ?As above. ? ?3. Ductal carcinoma in situ (DCIS) of left breast  ?ER and PR Positive.  Adding Bilateral Oophorectomy to her surgery. ? ?                      Patient was counseled as to the risk of surgery to include the following: ? ?1. Infection (prohylactic antibiotics will be administered) ? ?2.  DVT/Pulmonary Embolism (prophylactic pneumo compression stockings will be used) ? ?3.Trauma to internal organs requiring additional surgical procedure to repair any injury to internal organs requiring perhaps additional hospitalization days. ? ?4.Hemmorhage requiring transfusion and blood products which carry risks such as anaphylactic reaction, hepatitis and AIDS ? ?Patient had received literature information on the procedure scheduled and all her questions were answered and fully accepts all risk. ? ? ?[name] LavoieMD8:42 AM ? ?  ?[name] MD, 10:00 AM 03/14/2021 ? ? ? ?  ?

## 2021-03-15 ENCOUNTER — Encounter: Payer: Self-pay | Admitting: *Deleted

## 2021-03-15 ENCOUNTER — Telehealth: Payer: Self-pay | Admitting: *Deleted

## 2021-03-15 ENCOUNTER — Encounter: Payer: Self-pay | Admitting: Obstetrics & Gynecology

## 2021-03-15 ENCOUNTER — Other Ambulatory Visit: Payer: Self-pay | Admitting: Surgery

## 2021-03-15 ENCOUNTER — Telehealth: Payer: Self-pay | Admitting: Radiation Oncology

## 2021-03-15 DIAGNOSIS — D0512 Intraductal carcinoma in situ of left breast: Secondary | ICD-10-CM

## 2021-03-15 NOTE — Telephone Encounter (Signed)
LVM to schedule consult with Dr. Moody ?

## 2021-03-15 NOTE — Telephone Encounter (Signed)
Call placed to patient x2 using [employer], [place] and [phone].  ? ?Patient is scheduled for surgery on 03/19/21. Insurance expired 03/12/21.  ? ?Reviewed with Dr. Dellis Filbert and patient. Surgery cancelled at this time. Patient will contact office on Monday to provide update on insurance and will plan to reschedule.  ? ?Buren Kos present for both calls.  ? ?Patient verbalizes understanding.  ? ?Routing to [employer] for f/u of benefits.  ? ? ?

## 2021-03-19 ENCOUNTER — Ambulatory Visit (HOSPITAL_BASED_OUTPATIENT_CLINIC_OR_DEPARTMENT_OTHER): Admission: RE | Admit: 2021-03-19 | Payer: Self-pay | Source: Home / Self Care | Admitting: Obstetrics & Gynecology

## 2021-03-19 DIAGNOSIS — Z01818 Encounter for other preprocedural examination: Secondary | ICD-10-CM

## 2021-03-19 LAB — TYPE AND SCREEN
ABO/RH(D): O POS
Antibody Screen: NEGATIVE

## 2021-03-19 SURGERY — HYSTERECTOMY, ABDOMINAL, WITH SALPINGO-OOPHORECTOMY
Anesthesia: Choice | Laterality: Bilateral

## 2021-03-21 ENCOUNTER — Encounter: Payer: Self-pay | Admitting: *Deleted

## 2021-03-21 ENCOUNTER — Telehealth: Payer: Self-pay | Admitting: *Deleted

## 2021-03-21 NOTE — Telephone Encounter (Signed)
Spoke with patient with interpreter Almyra Free to follow up from [facility] 3/1 and assess navigation needs.  Patient had questions regarding cone discount. Gave contact information for our financial counselors.  No other questions or concerns at this time. Encouraged her to call should anything arise. ? ?

## 2021-03-22 ENCOUNTER — Telehealth: Payer: Self-pay | Admitting: Genetic Counselor

## 2021-03-22 ENCOUNTER — Encounter: Payer: Self-pay | Admitting: Genetic Counselor

## 2021-03-22 DIAGNOSIS — Z1379 Encounter for other screening for genetic and chromosomal anomalies: Secondary | ICD-10-CM | POA: Insufficient documentation

## 2021-03-22 NOTE — Telephone Encounter (Signed)
Revealed negative BRCAPlus STAT results with VUS in CHEK2.  ? ? ?

## 2021-03-22 NOTE — Telephone Encounter (Signed)
Per Bertha, spoke with patient. Patient has questions regarding self pay amount for surgery. Informed patient that I would touch base with her next week after looking into resources for her. ? ?Patient agreeable and appreciative. ? ?Routing to Glorianne Manchester, Therapist, sports, as Juluis Rainier. ?

## 2021-03-29 ENCOUNTER — Ambulatory Visit: Payer: Self-pay | Admitting: Genetic Counselor

## 2021-03-29 DIAGNOSIS — Z1379 Encounter for other screening for genetic and chromosomal anomalies: Secondary | ICD-10-CM

## 2021-03-29 DIAGNOSIS — Z803 Family history of malignant neoplasm of breast: Secondary | ICD-10-CM

## 2021-03-29 DIAGNOSIS — D0512 Intraductal carcinoma in situ of left breast: Secondary | ICD-10-CM

## 2021-03-29 NOTE — Telephone Encounter (Signed)
Revealed negative pan-cancer panel with VUS in CHEK2.  Language Line used ID: [id number] ? ? ?

## 2021-03-29 NOTE — Progress Notes (Signed)
HPI:   ?[name] was previously seen in the [place] clinic due to a personal and family history of cancer and concerns regarding a hereditary predisposition to cancer. Please refer to our prior cancer genetics clinic note for more information regarding our discussion, assessment and recommendations, at the time. [name] recent genetic test results were disclosed to her, as were recommendations warranted by these results. These results and recommendations are discussed in more detail below. ? ?CANCER HISTORY:  ?Oncology History  ?Ductal carcinoma in situ (DCIS) of left breast  ?03/06/2021 Initial Diagnosis  ? Left breast calcifications and diagnostic mammogram: 1.1 cm, additional mass at 2:00: 1.7 cm (fibroadenoma).  The calcifications and biopsy came back intermediate grade DCIS ER 95% PR 100%, 1 axillary lymph node was biopsied and was benign concordant ?  ?03/13/2021 Cancer Staging  ? Staging form: Breast, AJCC 8th Edition ?- Clinical: Stage 0 (cTis (DCIS), cN0, cM0, G2, ER+, PR+, HER2: Not Assessed) - Signed by Nicholas Lose, MD on 03/13/2021 ?Stage prefix: Initial diagnosis ?Histologic grading system: 3 grade system ? ?  ?03/21/2021 Genetic Testing  ? Negative hereditary cancer genetic testing: no pathogenic variants detected by [facility] BRCAPlus Panel or CustomNext-Cancer +RNAinsight.  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  The report dates are March 21, 2021 and March 27, 2021.  ? ?The BRCAplus panel offered by [employer] and includes sequencing and deletion/duplication analysis for the following 8 genes: ATM, BRCA1, BRCA2, CDH1, CHEK2, PALB2, PTEN, and TP53.  The CustomNext-Cancer+RNAinsight panel offered by [facility] includes sequencing and rearrangement analysis for the following 47 genes:  APC, ATM, AXIN2, BARD1, BMPR1A, BRCA1, BRCA2, BRIP1, CDH1, CDK4, CDKN2A, CHEK2, DICER1, EPCAM, GREM1, HOXB13, MEN1, MLH1, MSH2, MSH3,  MSH6, MUTYH, NBN, NF1, NF2, NTHL1, PALB2, PMS2, POLD1, POLE, PTEN, RAD51C, RAD51D, RECQL, RET, SDHA, SDHAF2, SDHB, SDHC, SDHD, SMAD4, SMARCA4, STK11, TP53, TSC1, TSC2, and VHL.  RNA data is routinely analyzed for use in variant interpretation for all genes. ?  ? ? ?FAMILY HISTORY:  ?We obtained a detailed, 4-generation family history.  Significant diagnoses are listed below: ?     ?Family History  ?Problem Relation Age of Onset  ? Breast cancer Maternal Aunt    ?      dx after 65  ?  ?  ?[name] is unaware of previous family history of genetic testing for hereditary cancer risks. Patient's maternal ancestors are of [place] descent, and paternal ancestors are of [place] descent. There is no reported Ashkenazi Jewish ancestry. There is no known consanguinity. ? ?GENETIC TEST RESULTS:  ?The [facility] CustomNext-Cancer +RNAinsight Panel found no pathogenic mutations. The CustomNext-Cancer+RNAinsight panel offered by [facility] includes sequencing and rearrangement analysis for the following 47 genes:  APC, ATM, AXIN2, BARD1, BMPR1A, BRCA1, BRCA2, BRIP1, CDH1, CDK4, CDKN2A, CHEK2, DICER1, EPCAM, GREM1, HOXB13, MEN1, MLH1, MSH2, MSH3, MSH6, MUTYH, NBN, NF1, NF2, NTHL1, PALB2, PMS2, POLD1, POLE, PTEN, RAD51C, RAD51D, RECQL, RET, SDHA, SDHAF2, SDHB, SDHC, SDHD, SMAD4, SMARCA4, STK11, TP53, TSC1, TSC2, and VHL.  RNA data is routinely analyzed for use in variant interpretation for all genes. ? ?The test report has been scanned into EPIC and is located under the Molecular Pathology section of the Results Review tab.  A portion of the result report is included below for reference. Genetic testing reported out on March 27, 2021.  ? ? ? ? 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  At this time, it is unknown if this variant is associated with an increased risk for cancer or if it is benign, but most uncertain variants are reclassified to  benign. It should not be used to make medical management decisions. With time, we suspect the laboratory will determine the significance of this variant, if any. If the laboratory reclassifies this variant, we will attempt to contact [name] to discuss it further.  ? ?Even though a pathogenic variant was not identified, possible explanations for the cancer in the family may include: ?There may be no hereditary risk for cancer in the family. The cancers in [name] and/or her family may be sporadic/familial or due to other genetic and environmental factors. ?There may be a gene mutation in one of these genes that current testing methods cannot detect but that chance is small. ?There could be another gene that has not yet been discovered, or that we have not yet tested, that is responsible for the cancer diagnoses in the family.  ?It is also possible there is a hereditary cause for the cancer in the family that [name] did not inherit. ?The variant of uncertain significance detected in the CHEK2 gene may be reclassified as a pathogenic variant in the future. At this time, we do not know if this variant increases the risk for cancer. ? ?Therefore, it is important to remain in touch with cancer genetics in the future so that we can continue to offer [name] the most up to date genetic testing.  ? ? ? ?ADDITIONAL GENETIC TESTING:  ?We discussed with [name] that her genetic testing was fairly extensive.  If there are genes identified to increase cancer risk that can be analyzed in the future, we would be happy to discuss and coordinate this testing at that time.   ? ?CANCER SCREENING RECOMMENDATIONS:  ?[name]'s test result is considered negative (normal).  This means that we have not identified a hereditary cause for her personal history of breast cancer at this time.  ? ?An individual's cancer risk and medical management are not  determined by genetic test results alone. Overall cancer risk assessment incorporates additional factors, including personal medical history, family history, and any available genetic information that may result in a personalized plan for cancer prevention and surveillance. Therefore, it is recommended she continue to follow the cancer management and screening guidelines provided by her oncology and primary healthcare provider. ? ?RECOMMENDATIONS FOR FAMILY MEMBERS:   ?Since she did not inherit a identifiable mutation in a cancer predisposition gene included on this panel, her children could not have inherited a known mutation from her in one of these genes. ?Individuals in this family might be at some increased risk of developing cancer, over the general population risk, due to the family history of cancer.  Individuals in the family should notify their providers of the family history of cancer. We recommend women in this family have a yearly mammogram beginning at age 42, or 45 years younger than the earliest onset of cancer, an annual clinical breast exam, and perform monthly breast self-exams.   ?We do not recommend familial testing for the CHEK2 variant of uncertain significance (VUS). ? ?FOLLOW-UP:  ?Lastly, we discussed with [name] that cancer genetics is a rapidly advancing field and it is possible that new genetic tests will be appropriate for her and/or her family members in the future. We encouraged her to remain in contact with cancer  genetics on an annual basis so we can update her personal and family histories and let her know of advances in cancer genetics that may benefit this family.  ? ?Our contact number was provided. [name] questions were answered to her satisfaction, and she knows she is welcome to call us at anytime with additional questions or concerns.  ? ?Helen Mccarthy M. Joette Catching, [place], [place] ?Genetic Counselor ?[email or web address] ?(P) [phone] ? ?

## 2021-04-01 ENCOUNTER — Encounter: Payer: BC Managed Care – PPO | Admitting: Obstetrics & Gynecology

## 2021-04-01 NOTE — Telephone Encounter (Signed)
Patient has decided to go with 04/30/2021 for her surgery date.  ?

## 2021-04-02 ENCOUNTER — Ambulatory Visit: Payer: Self-pay | Admitting: Emergency Medicine

## 2021-04-02 NOTE — Telephone Encounter (Signed)
Surgery request sent for 04/30/21.  ? ?Per review of Epic, patient is scheduled for left breast lumpectomy 04/17/21.  ? ?Routing to Dr. Dellis Filbert to review, ok to proceed as scheduled.  ?

## 2021-04-04 NOTE — Telephone Encounter (Signed)
Routing to [place] to review pre-op instructions and schedule pre-op and post-op appts.  ?

## 2021-04-09 ENCOUNTER — Encounter (HOSPITAL_BASED_OUTPATIENT_CLINIC_OR_DEPARTMENT_OTHER): Payer: Self-pay | Admitting: Surgery

## 2021-04-09 ENCOUNTER — Other Ambulatory Visit: Payer: Self-pay

## 2021-04-10 NOTE — Telephone Encounter (Signed)
Thurnell Garbe A, CMA  You 16 hours ago (4:10 PM)  ? ?Pre-op instructions were reviewed with the patient. Post-op appointments have been scheduled.  ?Pre-op instructions mailed to address on file.  ? ?Routing to provider for final review. Patient is agreeable to disposition. Will close encounter. ? ?Cc: Hayley Carder ?

## 2021-04-11 MED ORDER — ENSURE PRE-SURGERY PO LIQD: 296.0000 mL | Freq: Once | ORAL | Status: DC

## 2021-04-11 MED ORDER — CHLORHEXIDINE GLUCONATE CLOTH 2 % EX PADS: 6.0000 | MEDICATED_PAD | Freq: Once | CUTANEOUS | Status: DC

## 2021-04-11 NOTE — Progress Notes (Signed)
? ? ? ? ?  Enhanced Recovery after Surgery for Orthopedics ?Enhanced Recovery after Surgery is a protocol used to improve the stress on your body and your recovery after surgery. ? ?Patient Instructions ? ?The night before surgery:  ?No food after midnight. ONLY clear liquids after midnight ? ?The day of surgery (if you do NOT have diabetes):  ?Drink ONE (1) Pre-Surgery Clear Ensure as directed.   ?This drink was given to you during your hospital  ?pre-op appointment visit. ?The pre-op nurse will instruct you on the time to drink the  ?Pre-Surgery Ensure depending on your surgery time. ?Finish the drink at the designated time by the pre-op nurse.  ?Nothing else to drink after completing the  ?Pre-Surgery Clear Ensure. ? ?The day of surgery (if you have diabetes): ?Drink ONE (1) Gatorade 2 (G2) as directed. ?This drink was given to you during your hospital  ?pre-op appointment visit.  ?The pre-op nurse will instruct you on the time to drink the  ? Gatorade 2 (G2) depending on your surgery time. ?Color of the Gatorade may vary. Red is not allowed. ?Nothing else to drink after completing the  ?Gatorade 2 (G2). ? ?       If you have questions, please contact your surgeon?s office. ? ?Surgical soap given to patient with instruction. Patient verbalizes understanding. ?

## 2021-04-15 ENCOUNTER — Encounter: Payer: Self-pay | Admitting: Obstetrics & Gynecology

## 2021-04-15 ENCOUNTER — Ambulatory Visit (INDEPENDENT_AMBULATORY_CARE_PROVIDER_SITE_OTHER): Payer: Self-pay | Admitting: Obstetrics & Gynecology

## 2021-04-15 VITALS — BP 120/72 | HR 68 | Resp 16 | Ht <= 58 in | Wt 156.0 lb

## 2021-04-15 DIAGNOSIS — D219 Benign neoplasm of connective and other soft tissue, unspecified: Secondary | ICD-10-CM

## 2021-04-15 DIAGNOSIS — D0512 Intraductal carcinoma in situ of left breast: Secondary | ICD-10-CM

## 2021-04-15 DIAGNOSIS — R1031 Right lower quadrant pain: Secondary | ICD-10-CM

## 2021-04-15 NOTE — Progress Notes (Signed)
? ? ?Helen Mccarthy 10-Dec-1970 [id number] ? ? ?     51 y.o.  G4P4L4  ? ?RP: Preop TAH/BSO for large Fibroids on 04/30/2021 ? ?HPI:  Large symptomatic Uterine Fibroids. 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  LN Neg 03/06/2021.  Scheduled for a Left Lumpectomy 04/17/2021.   ? ? ?OB History  ?Gravida Para Term Preterm AB Living  ?'4 4 4     4  '$ ?SAB IAB Ectopic Multiple Live Births  ?        4  ?  ?# Outcome Date GA Lbr Len/2nd Weight Sex Delivery Anes PTL Lv  ?4 Term     F CS-Unspec  N LIV  ?3 Term     F CS-Unspec  N LIV  ?2 Term     M CS-Unspec  N LIV  ?1 Term     M CS-Unspec  N LIV  ?   Birth Comments: DECEASED THREE DAYS AFTER BEING BORN-   ? ? ?Past medical history,surgical history, problem list, medications, allergies, family history and social history were all reviewed and documented in the EPIC chart. ? ? ?Directed ROS with pertinent positives and negatives documented in the history of present illness/assessment and plan. ? ?Exam: ? ?Vitals:  ? 04/15/21 1642  ?BP: 120/72  ?Pulse: 68  ?Resp: 16  ?Weight: 156 lb (70.8 kg)  ?Height: '4\' 10"'$  (1.473 m)  ? ?General appearance:  Normal ? ?Gynecologic exam: Deferred ? ?Pelvic US 02/12/2021: Comparison is made with previous scan in 2019.  Transabdominal scan.  Grossly enlarged uterus with large fibroid to the right fundus measured at more than 12 x 8 cm.  Both uterus and fibroids are increased in size since previous scan.  The large fibroid is very tender to palpation.  3 other smaller fibroids are present to the left fundus.  The overall uterine size is measured at 18.73 x 13.66 x 10.5 cm.  The volume is estimated at 1406.6 cm cube.  The endometrial lining is symmetrical measured at 7.8 mm with no thickening or mass seen.  Both ovaries are identified and normal in size with sparse follicles.  No free fluid in the pelvis. ? ? ?Assessment/Plan:  51 y.o. [id number]  ? ?1. Fibroids ?Large symptomatic Uterine Fibroids. Will proceed with a  TAH/BSO.  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  Surgery and risks thoroughly reviewed.  Preop preparation and postop expectations and precautions reviewed.  ? ?2. Right lower quadrant pain ?Probably d/t pressure from large fibroids. ? ?3. 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  LN Neg 03/06/2021.  Scheduled for a Left Lumpectomy 04/17/2021.   ? ?                      Patient was counseled as to the risk of surgery to include the following: ? ?1. Infection (prohylactic antibiotics will be administered) ? ?2. DVT/Pulmonary Embolism (prophylactic pneumo compression stockings will be used) ? ?3.Trauma to internal organs requiring additional surgical procedure to repair any injury to internal organs requiring perhaps additional hospitalization days. ? ?4.Hemmorhage requiring transfusion and blood products which carry risks such as anaphylactic reaction, hepatitis and AIDS ? ?Patient had received literature information on the procedure scheduled and all her questions were answered and fully accepts all risk. ?  ?[name]  Mynor Witkop MD, 5:26 PM 04/15/2021 ? ? ? ?  ?

## 2021-04-16 ENCOUNTER — Ambulatory Visit
Admission: RE | Admit: 2021-04-16 | Discharge: 2021-04-16 | Disposition: A | Discharge status: Home / Self Care | Payer: Self-pay | Source: Ambulatory Visit | Attending: Surgery | Admitting: Surgery

## 2021-04-16 ENCOUNTER — Other Ambulatory Visit: Payer: Self-pay | Admitting: Surgery

## 2021-04-16 DIAGNOSIS — D0512 Intraductal carcinoma in situ of left breast: Secondary | ICD-10-CM

## 2021-04-16 NOTE — H&P (Signed)
? ?PROVIDER: Beverlee Nims, MD ? ?MRN: [id number] ?DOB: 09-15-1970 ? ?Subjective  ? ?Chief Complaint: Breast Cancer ? ? ?History of Present Illness: ?Helen Mccarthy is a 51 y.o. female who is seen tas an office consultation for evaluation of Breast Cancer ?.  ? ?This is a 51 year old female. She does not speak [place].  She was found on Recent screening to have abnormal calcifications. 2 areas in the breast were biopsied. 1 showed ductal carcinoma in situ which was 95% ER positive and 100% PR positive. The other was consistent with a fibroadenoma. She also had a lymph node biopsy which was benign. She reports no previous problems regarding her breast. She has had no previous breast biopsies. She reports having a maternal aunt who died in her 1s of breast cancer. She reports this was recent. She is scheduled on March 7 to have a hysterectomy for painful bleeding fibroids. She has no cardiopulmonary issues. She denies nipple discharge ? ?Review of Systems: ?A complete review of systems was obtained from the patient. I have reviewed this information and discussed as appropriate with the patient. See HPI as well for other ROS. ? ?ROS  ? ?Medical History: ?Past Medical History:  ?Diagnosis Date  ? History of cancer  ? Hyperlipidemia  ? ?Patient Active Problem List  ?Diagnosis  ? Hyperlipidemia  ? Ductal carcinoma in situ (DCIS) of left breast  ? ?Past Surgical History:  ?Procedure Laterality Date  ? Dilatation and Curettage N/A 08/28/2017  ? CESAREAN SECTION N/A  ?Date unknown  ? Right Knee Surgery Right  ?Date Unknown  ? Shoulder Surgery Right  ?Date Unknown  ? ? ?No Known Allergies ? ?Current Outpatient Medications on File Prior to Visit  ?Medication Sig Dispense Refill  ? methocarbamoL (ROBAXIN) 500 MG tablet Take 500 mg by mouth every 6 (six) hours as needed  ? oxyCODONE (ROXICODONE) 5 MG immediate release tablet Take 5 mg by mouth every 6 (six) hours as needed  ? ascorbic acid, vitamin C, (VITAMIN C) 1000 MG  tablet Take by mouth  ? calcium carbonate 600 mg calcium (1,500 mg) Tab tablet Take by mouth  ? ferrous sulfate 325 (65 FE) MG tablet Take by mouth  ? multivitamin tablet Take 1 tablet by mouth once daily  ? ?No current facility-administered medications on file prior to visit.  ? ?Family History  ?Problem Relation Age of Onset  ? High blood pressure (Hypertension) Mother  ? High blood pressure (Hypertension) Father  ? Breast cancer Maternal Aunt  ? Diabetes Paternal Aunt  ? Diabetes Paternal Uncle  ? ? ?Social History  ? ?Tobacco Use  ?Smoking Status Never  ?Smokeless Tobacco Never  ? ? ?Social History  ? ?Socioeconomic History  ? Marital status: Unknown  ?Tobacco Use  ? Smoking status: Never  ? Smokeless tobacco: Never  ?Vaping Use  ? Vaping Use: Never used  ?Substance and Sexual Activity  ? Alcohol use: Yes  ?Comment: "Occasionally"  ? Drug use: Never  ? ?Objective:  ?There were no vitals filed for this visit.  ?There is no height or weight on file to calculate BMI. ? ?Physical Exam  ? ?She appears well on exam ? ?There are no palpable breast masses in either breast. The nipple areolar complex is normal. There is minimal ecchymosis from her breast biopsies. There is no axillary adenopathy ? ?Labs, Imaging and Diagnostic Testing: ?I reviewed her mammograms, ultrasound, pathology results ? ?Assessment and Plan:  ? ?Ductal carcinoma in situ of the left  breast ? ?Through the interpreter, we discussed her diagnosis.  We also discussed her is our breast cancer conference. I discussed the diagnosis of DCIS with her in detail. We discussed treatment options including lumpectomy with postprocedural radiation versus mastectomy. She wishes to proceed with breast conservation. I next discussed proceeding with a radioactive seed guided left breast lumpectomy. I explained the procedure to them in detail. We discussed the risk of surgery which includes but is not limited to bleeding, infection, the need for further surgery if  margins are positive, cardiopulmonary issues, postoperative recovery, etc. They understand and wish to proceed with surgery. We will need to wait at least 2 weeks after the hysterectomy.  ?

## 2021-04-16 NOTE — Anesthesia Preprocedure Evaluation (Addendum)
Anesthesia Evaluation  ?Patient identified by MRN, date of birth, ID band ?Patient awake ? ? ? ?Reviewed: ?Allergy & Precautions, NPO status , Patient's Chart, lab work & pertinent test results ? ?Airway ?Mallampati: II ? ?TM Distance: >3 FB ?Neck ROM: Full ? ? ? Dental ?no notable dental hx. ? ?  ?Pulmonary ?neg pulmonary ROS,  ?  ?Pulmonary exam normal ?breath sounds clear to auscultation ? ? ? ? ? ? Cardiovascular ?negative cardio ROS ?Normal cardiovascular exam ?Rhythm:Regular Rate:Normal ? ? ?  ?Neuro/Psych ?negative neurological ROS ? negative psych ROS  ? GI/Hepatic ?negative GI ROS, Neg liver ROS,   ?Endo/Other  ?negative endocrine ROS ? Renal/GU ?negative Renal ROS  ? ?  ?Musculoskeletal ?negative musculoskeletal ROS ?(+)  ? Abdominal ?(+) + obese,   ?Peds ? Hematology ?negative hematology ROS ?(+)   ?Anesthesia Other Findings ?LEFT BREAST DCIS ? Reproductive/Obstetrics ?S/p tubal ligation ? ?  ? ? ? ? ? ? ? ? ? ? ? ? ? ?  ?  ? ? ? ? ? ? ? ?Anesthesia Physical ?Anesthesia Plan ? ?ASA: 1 ? ?Anesthesia Plan: General  ? ?Post-op Pain Management:   ? ?Induction: Intravenous ? ?PONV Risk Score and Plan: 3 and Ondansetron, Dexamethasone, Midazolam and Treatment may vary due to age or medical condition ? ?Airway Management Planned: LMA ? ?Additional Equipment:  ? ?Intra-op Plan:  ? ?Post-operative Plan: Extubation in OR ? ?Informed Consent: I have reviewed the patients History and Physical, chart, labs and discussed the procedure including the risks, benefits and alternatives for the proposed anesthesia with the patient or authorized representative who has indicated his/her understanding and acceptance.  ? ? ? ?Dental advisory given and Interpreter used for interveiw ? ?Plan Discussed with: CRNA ? ?Anesthesia Plan Comments:   ? ? ? ? ? ?Anesthesia Quick Evaluation ? ?

## 2021-04-17 ENCOUNTER — Ambulatory Visit (HOSPITAL_BASED_OUTPATIENT_CLINIC_OR_DEPARTMENT_OTHER): Payer: Self-pay | Admitting: Certified Registered"

## 2021-04-17 ENCOUNTER — Encounter (HOSPITAL_BASED_OUTPATIENT_CLINIC_OR_DEPARTMENT_OTHER): Payer: Self-pay | Admitting: Surgery

## 2021-04-17 ENCOUNTER — Other Ambulatory Visit: Payer: Self-pay

## 2021-04-17 ENCOUNTER — Encounter (HOSPITAL_BASED_OUTPATIENT_CLINIC_OR_DEPARTMENT_OTHER)
Admission: RE | Disposition: A | Discharge status: Home / Self Care | Payer: Self-pay | Source: Home / Self Care | Attending: Surgery

## 2021-04-17 ENCOUNTER — Ambulatory Visit (HOSPITAL_BASED_OUTPATIENT_CLINIC_OR_DEPARTMENT_OTHER)
Admission: RE | Admit: 2021-04-17 | Discharge: 2021-04-17 | Disposition: A | Discharge status: Home / Self Care | Payer: Self-pay | Attending: Surgery | Admitting: Surgery

## 2021-04-17 ENCOUNTER — Ambulatory Visit
Admission: RE | Admit: 2021-04-17 | Discharge: 2021-04-17 | Disposition: A | Discharge status: Home / Self Care | Payer: Self-pay | Source: Ambulatory Visit | Attending: Surgery | Admitting: Surgery

## 2021-04-17 DIAGNOSIS — D0512 Intraductal carcinoma in situ of left breast: Secondary | ICD-10-CM

## 2021-04-17 DIAGNOSIS — N6022 Fibroadenosis of left breast: Secondary | ICD-10-CM | POA: Insufficient documentation

## 2021-04-17 DIAGNOSIS — N6082 Other benign mammary dysplasias of left breast: Secondary | ICD-10-CM | POA: Insufficient documentation

## 2021-04-17 DIAGNOSIS — Z803 Family history of malignant neoplasm of breast: Secondary | ICD-10-CM | POA: Insufficient documentation

## 2021-04-17 HISTORY — PX: BREAST LUMPECTOMY WITH RADIOACTIVE SEED LOCALIZATION: SHX6424

## 2021-04-17 SURGERY — BREAST LUMPECTOMY WITH RADIOACTIVE SEED LOCALIZATION
Anesthesia: General | Site: Breast | Laterality: Left

## 2021-04-17 MED ORDER — FENTANYL CITRATE (PF) 100 MCG/2ML IJ SOLN
INTRAMUSCULAR | Status: AC
  Filled 2021-04-17: qty 2

## 2021-04-17 MED ORDER — BUPIVACAINE-EPINEPHRINE (PF) 0.5% -1:200000 IJ SOLN
INTRAMUSCULAR | Status: AC
  Filled 2021-04-17: qty 180

## 2021-04-17 MED ORDER — ONDANSETRON HCL 4 MG/2ML IJ SOLN
INTRAMUSCULAR | Status: AC
  Filled 2021-04-17: qty 2

## 2021-04-17 MED ORDER — ONDANSETRON HCL 4 MG/2ML IJ SOLN
INTRAMUSCULAR | Status: DC | PRN
Start: 2021-04-17 — End: 2021-04-17
  Administered 2021-04-17: 4 mg via INTRAVENOUS

## 2021-04-17 MED ORDER — BUPIVACAINE-EPINEPHRINE 0.5% -1:200000 IJ SOLN
INTRAMUSCULAR | Status: DC | PRN
  Administered 2021-04-17: 20 mL

## 2021-04-17 MED ORDER — PROPOFOL 10 MG/ML IV BOLUS
INTRAVENOUS | Status: DC | PRN
  Administered 2021-04-17: 200 mg via INTRAVENOUS

## 2021-04-17 MED ORDER — FENTANYL CITRATE (PF) 100 MCG/2ML IJ SOLN
INTRAMUSCULAR | Status: DC | PRN
Start: 2021-04-17 — End: 2021-04-17
  Administered 2021-04-17 (×2): 25 ug via INTRAVENOUS

## 2021-04-17 MED ORDER — TRAMADOL HCL 50 MG PO TABS: 50.0000 mg | ORAL_TABLET | Freq: Four times a day (QID) | ORAL | 0 refills | Status: DC | PRN

## 2021-04-17 MED ORDER — MIDAZOLAM HCL 5 MG/5ML IJ SOLN
INTRAMUSCULAR | Status: DC | PRN
  Administered 2021-04-17: 2 mg via INTRAVENOUS

## 2021-04-17 MED ORDER — ACETAMINOPHEN 500 MG PO TABS
1000.0000 mg | ORAL_TABLET | ORAL | Status: AC
  Administered 2021-04-17: 1000 mg via ORAL

## 2021-04-17 MED ORDER — DEXAMETHASONE SODIUM PHOSPHATE 10 MG/ML IJ SOLN
INTRAMUSCULAR | Status: AC
  Filled 2021-04-17: qty 1

## 2021-04-17 MED ORDER — DEXAMETHASONE SODIUM PHOSPHATE 10 MG/ML IJ SOLN
INTRAMUSCULAR | Status: DC | PRN
  Administered 2021-04-17: 10 mg via INTRAVENOUS

## 2021-04-17 MED ORDER — ACETAMINOPHEN 500 MG PO TABS
ORAL_TABLET | ORAL | Status: AC
  Filled 2021-04-17: qty 2

## 2021-04-17 MED ORDER — PROPOFOL 10 MG/ML IV BOLUS
INTRAVENOUS | Status: AC
  Filled 2021-04-17: qty 20

## 2021-04-17 MED ORDER — MIDAZOLAM HCL 2 MG/2ML IJ SOLN
INTRAMUSCULAR | Status: AC
Start: 2021-04-17 — End: ?
  Filled 2021-04-17: qty 2

## 2021-04-17 MED ORDER — PHENYLEPHRINE 40 MCG/ML (10ML) SYRINGE FOR IV PUSH (FOR BLOOD PRESSURE SUPPORT)
PREFILLED_SYRINGE | INTRAVENOUS | Status: AC
  Filled 2021-04-17: qty 10

## 2021-04-17 MED ORDER — CEFAZOLIN SODIUM-DEXTROSE 2-4 GM/100ML-% IV SOLN
2.0000 g | INTRAVENOUS | Status: AC
  Administered 2021-04-17: 2 g via INTRAVENOUS

## 2021-04-17 MED ORDER — ATROPINE SULFATE 0.4 MG/ML IV SOLN
INTRAVENOUS | Status: AC
  Filled 2021-04-17: qty 1

## 2021-04-17 MED ORDER — OXYCODONE HCL 5 MG PO TABS: 5.0000 mg | ORAL_TABLET | Freq: Once | ORAL | Status: DC | PRN

## 2021-04-17 MED ORDER — KETOROLAC TROMETHAMINE 30 MG/ML IJ SOLN
INTRAMUSCULAR | Status: AC
Start: 2021-04-17 — End: ?
  Filled 2021-04-17: qty 1

## 2021-04-17 MED ORDER — AMISULPRIDE (ANTIEMETIC) 5 MG/2ML IV SOLN: 10.0000 mg | Freq: Once | INTRAVENOUS | Status: DC | PRN

## 2021-04-17 MED ORDER — LACTATED RINGERS IV SOLN: INTRAVENOUS | Status: DC

## 2021-04-17 MED ORDER — OXYCODONE HCL 5 MG/5ML PO SOLN: 5.0000 mg | Freq: Once | ORAL | Status: DC | PRN

## 2021-04-17 MED ORDER — FENTANYL CITRATE (PF) 100 MCG/2ML IJ SOLN
25.0000 ug | INTRAMUSCULAR | Status: DC | PRN
  Administered 2021-04-17: 25 ug via INTRAVENOUS

## 2021-04-17 MED ORDER — PHENYLEPHRINE HCL (PRESSORS) 10 MG/ML IV SOLN
INTRAVENOUS | Status: DC | PRN
  Administered 2021-04-17: 40 ug via INTRAVENOUS

## 2021-04-17 MED ORDER — LIDOCAINE 2% (20 MG/ML) 5 ML SYRINGE
INTRAMUSCULAR | Status: AC
Start: 2021-04-17 — End: ?
  Filled 2021-04-17: qty 5

## 2021-04-17 MED ORDER — CEFAZOLIN SODIUM-DEXTROSE 2-4 GM/100ML-% IV SOLN
INTRAVENOUS | Status: AC
  Filled 2021-04-17: qty 100

## 2021-04-17 MED ORDER — KETOROLAC TROMETHAMINE 30 MG/ML IJ SOLN: 30.0000 mg | Freq: Once | INTRAMUSCULAR | Status: DC | PRN

## 2021-04-17 MED ORDER — LIDOCAINE HCL (CARDIAC) PF 100 MG/5ML IV SOSY
PREFILLED_SYRINGE | INTRAVENOUS | Status: DC | PRN
  Administered 2021-04-17: 60 mg via INTRAVENOUS

## 2021-04-17 SURGICAL SUPPLY — 37 items
ADH SKN CLS APL DERMABOND .7 (GAUZE/BANDAGES/DRESSINGS) ×1
APL PRP STRL LF DISP 70% ISPRP (MISCELLANEOUS) ×1
BINDER BREAST 3XL (GAUZE/BANDAGES/DRESSINGS) IMPLANT
BINDER BREAST XLRG (GAUZE/BANDAGES/DRESSINGS) ×1 IMPLANT
BINDER BREAST XXLRG (GAUZE/BANDAGES/DRESSINGS) IMPLANT
BLADE SURG 15 STRL LF DISP TIS (BLADE) ×1 IMPLANT
BLADE SURG 15 STRL SS (BLADE) ×2
CHLORAPREP W/TINT 26 (MISCELLANEOUS) ×2 IMPLANT
COVER BACK TABLE 60X90IN (DRAPES) ×2 IMPLANT
COVER MAYO STAND STRL (DRAPES) ×2 IMPLANT
COVER PROBE W GEL 5X96 (DRAPES) ×2 IMPLANT
DERMABOND ADVANCED (GAUZE/BANDAGES/DRESSINGS) ×1
DERMABOND ADVANCED .7 DNX12 (GAUZE/BANDAGES/DRESSINGS) ×1 IMPLANT
DRAPE LAPAROSCOPIC ABDOMINAL (DRAPES) ×2 IMPLANT
DRAPE UTILITY XL STRL (DRAPES) ×2 IMPLANT
ELECT REM PT RETURN 9FT ADLT (ELECTROSURGICAL) ×2
ELECTRODE REM PT RTRN 9FT ADLT (ELECTROSURGICAL) ×1 IMPLANT
GLOVE SURG POLYISO LF SZ7 (GLOVE) ×2 IMPLANT
GLOVE SURG SIGNA 7.5 PF LTX (GLOVE) ×2 IMPLANT
GLOVE SURG UNDER POLY LF SZ7 (GLOVE) ×2 IMPLANT
GOWN STRL REUS W/ TWL LRG LVL3 (GOWN DISPOSABLE) ×1 IMPLANT
GOWN STRL REUS W/ TWL XL LVL3 (GOWN DISPOSABLE) ×1 IMPLANT
GOWN STRL REUS W/TWL LRG LVL3 (GOWN DISPOSABLE) ×4
GOWN STRL REUS W/TWL XL LVL3 (GOWN DISPOSABLE) ×2
KIT MARKER MARGIN INK (KITS) ×2 IMPLANT
NDL HYPO 25X1 1.5 SAFETY (NEEDLE) ×1 IMPLANT
NEEDLE HYPO 25X1 1.5 SAFETY (NEEDLE) ×2 IMPLANT
PACK BASIN DAY SURGERY FS (CUSTOM PROCEDURE TRAY) ×2 IMPLANT
PENCIL SMOKE EVACUATOR (MISCELLANEOUS) ×2 IMPLANT
SLEEVE SCD COMPRESS KNEE MED (STOCKING) ×2 IMPLANT
SPONGE T-LAP 4X18 ~~LOC~~+RFID (SPONGE) ×2 IMPLANT
SUT MNCRL AB 4-0 PS2 18 (SUTURE) ×2 IMPLANT
SUT VIC AB 3-0 SH 27 (SUTURE) ×2
SUT VIC AB 3-0 SH 27X BRD (SUTURE) ×1 IMPLANT
SYR CONTROL 10ML LL (SYRINGE) ×2 IMPLANT
TOWEL GREEN STERILE FF (TOWEL DISPOSABLE) ×2 IMPLANT
TRAY FAXITRON CT DISP (TRAY / TRAY PROCEDURE) ×2 IMPLANT

## 2021-04-17 NOTE — Op Note (Signed)
LEFT BREAST LUMPECTOMY WITH RADIOACTIVE SEED LOCALIZATION  Procedure Note ? ?Helen Mccarthy ?04/17/2021 ? ? ?Pre-op Diagnosis: LEFT BREAST DCIS ?    ?Post-op Diagnosis: same ? ?Procedure(s): ?LEFT BREAST LUMPECTOMY WITH RADIOACTIVE SEED LOCALIZATION ? ?Surgeon(s): ?Coralie Keens, MD ? ?Assistant: Mack Hook, MD [facility] Resident ? ?Anesthesia: General ? ?Staff:  ?Circulator: Izora Ribas, RN ?Scrub Person: Eston Esters, RN ? ?Estimated Blood Loss: Minimal ?              ?Specimens: sent to path ? ?Indications: This is a-year-old Hispanic female found to have abnormal calcifications in the left breast on screening mammography.  There was a separate smaller mass.  The calcification biopsy showed DCIS.  The smaller mass was biopsied and was consistent with a possible fibroadenoma.  After discussion with the patient.  She wanted just the DCIS removed. ? ?Procedure: The patient is brought to operating identifies correct patient.  She is placed upon the operating table general anesthesia was induced.  Her left breast was prepped and draped in usual sterile fashion.  Using the neoprobe the radioactive seed was located in the upper outer quadrant of the breast several centimeters from the nipple areolar complex.  We anesthetized the lateral edge of the areola with Marcaine and then made a circumareolar areolar incision with a scalpel.  I then dissected down to the breast tissue with the cautery and then dissected laterally toward the radioactive seed with aid of neoprobe.  We were able to dissect past the seed and then posteriorly with the aid of neoprobe.  We appear to stay widely around the seed as we completed a lumpectomy coming around it circumferentially and then underneath close to the chest wall.  Once the specimen was completely removed all margins were marked with paint.  An x-ray was performed on the specimen confirming the radioactive seed and previous biopsy clip were in the specimen.   Specimen was then sent to pathology for evaluation.  Hemostasis was achieved with cautery.  The incision was anesthetized further with Marcaine.  We closed some of the deep tissue with interrupted 3-0 Vicryl sutures.  The subcutaneous tissue was then closed with interrupted 3-0 Vicryl sutures and the skin was closed with a running 4-0 Monocryl.  Dermabond was then applied.  The patient was next placed in a breast binder.  The patient tolerated the procedure well.  All the counts were correct at the end of the procedure.  The patient was then extubated in the operating room and taken in stable addition to the recovery room. ?        ? ?Coralie Keens  ? ?Date: 04/17/2021  Time: 9:21 AM ? ? ? ?

## 2021-04-17 NOTE — Anesthesia Procedure Notes (Signed)
Procedure Name: LMA Insertion ?Date/Time: 04/17/2021 8:26 AM ?Performed by: Lavonia Dana, CRNA ?Pre-anesthesia Checklist: Patient identified, Emergency Drugs available, Suction available and Patient being monitored ?Patient Re-evaluated:Patient Re-evaluated prior to induction ?Oxygen Delivery Method: Circle system utilized ?Preoxygenation: Pre-oxygenation with 100% oxygen ?Induction Type: IV induction ?Ventilation: Mask ventilation without difficulty ?LMA: LMA inserted ?LMA Size: 4.0 ?Number of attempts: 1 ?Airway Equipment and Method: Bite block ?Placement Confirmation: positive ETCO2 ?Tube secured with: Tape ?Dental Injury: Teeth and Oropharynx as per pre-operative assessment  ? ? ? ? ?

## 2021-04-17 NOTE — Discharge Instructions (Addendum)
[facility] °Office Phone Number [phone] ° °BREAST BIOPSY/ PARTIAL MASTECTOMY: POST OP INSTRUCTIONS ° °Always review your discharge instruction sheet given to you by the facility where your surgery was performed. ° °IF YOU HAVE DISABILITY OR FAMILY LEAVE FORMS, YOU MUST BRING THEM TO THE OFFICE FOR PROCESSING.  DO NOT GIVE THEM TO YOUR DOCTOR. ° °A prescription for pain medication may be given to you upon discharge.  Take your pain medication as prescribed, if needed.  If narcotic pain medicine is not needed, then you may take acetaminophen (Tylenol) or ibuprofen (Advil) as needed. °Take your usually prescribed medications unless otherwise directed °If you need a refill on your pain medication, please contact your pharmacy.  They will contact our office to request authorization.  Prescriptions will not be filled after 5pm or on week-ends. °You should eat very light the first 24 hours after surgery, such as soup, crackers, pudding, etc.  Resume your normal diet the day after surgery. °Most patients will experience some swelling and bruising in the breast.  Ice packs and a good support bra will help.  Swelling and bruising can take several days to resolve.  °It is common to experience some constipation if taking pain medication after surgery.  Increasing fluid intake and taking a stool softener will usually help or prevent this problem from occurring.  A mild laxative (Milk of Magnesia or Miralax) should be taken according to package directions if there are no bowel movements after 48 hours. °Unless discharge instructions indicate otherwise, you may remove your bandages 24-48 hours after surgery, and you may shower at that time.  You may have steri-strips (small skin tapes) in place directly over the incision.  These strips should be left on the skin for 7-10 days.  If your surgeon used skin glue on the incision, you may shower in 24 hours.  The glue will flake off over the next 2-3 weeks.  Any  sutures or staples will be removed at the office during your follow-up visit. °ACTIVITIES:  You may resume regular daily activities (gradually increasing) beginning the next day.  Wearing a good support bra or sports bra minimizes pain and swelling.  You may have sexual intercourse when it is comfortable. °You may drive when you no longer are taking prescription pain medication, you can comfortably wear a seatbelt, and you can safely maneuver your car and apply brakes. °RETURN TO WORK:  ______________________________________________________________________________________ °You should see your doctor in the office for a follow-up appointment approximately two weeks after your surgery.  Your doctor’s nurse will typically make your follow-up appointment when she calls you with your pathology report.  Expect your pathology report 2-3 business days after your surgery.  You may call to check if you do not hear from us after three days. °OTHER INSTRUCTIONS: OK TO REMOVE THE BINDER AND SHOWER STARTING TOMORROW °ICE PACK, TYLENOL, AND IBUPROFEN ALSO FOR PAIN °NO VIGOROUS ACTIVITY FOR ONE WEEK °_______________________________________________________________________________________________ _____________________________________________________________________________________________________________________________________ °_____________________________________________________________________________________________________________________________________ °_____________________________________________________________________________________________________________________________________ ° °WHEN TO CALL YOUR DOCTOR: °Fever over 101.0 °Nausea and/or vomiting. °Extreme swelling or bruising. °Continued bleeding from incision. °Increased pain, redness, or drainage from the incision. ° °The clinic staff is available to answer your questions during regular business hours.  Please don’t hesitate to call and ask to speak to one of the  nurses for clinical concerns.  If you have a medical emergency, go to the nearest emergency room or call 911.  A surgeon from [facility] is always on call at the hospital. ° °For   further questions, please visit [email or web address]   °

## 2021-04-17 NOTE — Interval H&P Note (Signed)
History and Physical Interval Note:no change in H and P ? ?04/17/2021 ?7:52 AM ? ?Helen Mccarthy  has presented today for surgery, with the diagnosis of LEFT BREAST DCIS.  The various methods of treatment have been discussed with the patient and family. After consideration of risks, benefits and other options for treatment, the patient has consented to  Procedure(s): ?LEFT BREAST LUMPECTOMY WITH RADIOACTIVE SEED LOCALIZATION (Left) as a surgical intervention.  The patient's history has been reviewed, patient examined, no change in status, stable for surgery.  I have reviewed the patient's chart and labs.  Questions were answered to the patient's satisfaction.   ? ? ?Coralie Keens ? ? ?

## 2021-04-17 NOTE — Progress Notes (Signed)
? ?Patient Care Team: ?[name], MD as PCP - General (Obstetrics and Gynecology) ?Rockwell Germany, RN as Oncology Nurse Navigator ?Mauro Kaufmann, RN as Oncology Nurse Navigator ?Coralie Keens, MD as Consulting Physician (General Surgery) ?Nicholas Lose, MD as Consulting Physician (Hematology and Oncology) ?Kyung Rudd, MD as Consulting Physician (Radiation Oncology) ? ?DIAGNOSIS:  ?Encounter Diagnosis  ?Name Primary?  ? Ductal carcinoma in situ (DCIS) of left breast   ? ? ?SUMMARY OF ONCOLOGIC HISTORY: ?Oncology History  ?Ductal carcinoma in situ (DCIS) of left breast  ?03/06/2021 Initial Diagnosis  ? Left breast calcifications and diagnostic mammogram: 1.1 cm, additional mass at 2:00: 1.7 cm (fibroadenoma).  The calcifications and biopsy came back intermediate grade DCIS ER 95% PR 100%, 1 axillary lymph node was biopsied and was benign concordant ?  ?03/13/2021 Cancer Staging  ? Staging form: Breast, AJCC 8th Edition ?- Clinical: Stage 0 (cTis (DCIS), cN0, cM0, G2, ER+, PR+, HER2: Not Assessed) - Signed by Nicholas Lose, MD on 03/13/2021 ?Stage prefix: Initial diagnosis ?Histologic grading system: 3 grade system ? ?  ?03/21/2021 Genetic Testing  ? Negative hereditary cancer genetic testing: no pathogenic variants detected by [facility] BRCAPlus Panel or CustomNext-Cancer +RNAinsight.  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  The report dates are March 21, 2021 and March 27, 2021.  ? ?The BRCAplus panel offered by [employer] and includes sequencing and deletion/duplication analysis for the following 8 genes: ATM, BRCA1, BRCA2, CDH1, CHEK2, PALB2, PTEN, and TP53.  The CustomNext-Cancer+RNAinsight panel offered by [employer] includes sequencing and rearrangement analysis for the following 47 genes:  APC, ATM, AXIN2, BARD1, BMPR1A, BRCA1, BRCA2, BRIP1, CDH1, CDK4, CDKN2A, CHEK2, DICER1, EPCAM, GREM1, HOXB13, MEN1, MLH1, MSH2, MSH3, MSH6, MUTYH, NBN, NF1, NF2, NTHL1,  PALB2, PMS2, POLD1, POLE, PTEN, RAD51C, RAD51D, RECQL, RET, SDHA, SDHAF2, SDHB, SDHC, SDHD, SMAD4, SMARCA4, STK11, TP53, TSC1, TSC2, and VHL.  RNA data is routinely analyzed for use in variant interpretation for all genes. ?  ?04/17/2021 Surgery  ? Left lumpectomy: Residual DCIS not identified.  Focal ALH, fibrocystic changes ?  ? ? ?CHIEF COMPLIANT:  Follow up to Discuss pathology report ? ?INTERVAL HISTORY: Helen Mccarthy is a  51 y.o. female is here because of recent diagnosis of DCIS of the left breast. She presents to the clinic today to discuss pathology report.. She states everything went well after surgery. ? ? ?ALLERGIES:  has No Known Allergies. ? ?MEDICATIONS:  ?Current Outpatient Medications  ?Medication Sig Dispense Refill  ? Ascorbic Acid (VITAMIN C PO) Take by mouth.    ? calcium carbonate (OSCAL) 1500 (600 Ca) MG TABS tablet Take by mouth 2 (two) times daily with a meal.    ? GARLIC OIL PO Take by mouth.    ? Glucosamine HCl (GLUCOSAMINE PO) Take by mouth.    ? Multiple Vitamin (MULTIVITAMIN) tablet Take 1 tablet by mouth daily.    ? Omega-3 1400 MG CAPS Take by mouth.    ? traMADol (ULTRAM) 50 MG tablet Take 1 tablet (50 mg total) by mouth every 6 (six) hours as needed. 25 tablet 0  ? VITAMIN D PO Take by mouth.    ? VITAMIN E PO Take by mouth.    ? ?No current facility-administered medications for this visit.  ? ? ?PHYSICAL EXAMINATION: ?ECOG PERFORMANCE STATUS: 1 - Symptomatic but completely ambulatory ? ?Vitals:  ? 04/29/21 0936  ?BP: (!) 150/73  ?Pulse: 72  ?Resp: 18  ?Temp: (!) 97.5 ?F (36.4 ?  C)  ?SpO2: 100%  ? ?Filed Weights  ? 04/29/21 0936  ?Weight: 158 lb 3.2 oz (71.8 kg)  ? ?  ? ?LABORATORY DATA:  ?I have reviewed the data as listed ? ?  Latest Ref Rng & Units 03/13/2021  ?  8:27 AM 12/13/2020  ?  9:53 AM 05/27/2017  ?  2:40 PM  ?CMP  ?Glucose 70 - 99 mg/dL 112   90   95    ?BUN 6 - 20 mg/dL '13   12   9    ' ?Creatinine 0.44 - 1.00 mg/dL 0.74   0.48   0.61    ?Sodium 135 - 145 mmol/L  137   141   138    ?Potassium 3.5 - 5.1 mmol/L 4.0   3.8   3.8    ?Chloride 98 - 111 mmol/L 106   107   101    ?CO2 22 - 32 mmol/L '23   28   29    ' ?Calcium 8.9 - 10.3 mg/dL 10.0   8.8   9.5    ?Total Protein 6.5 - 8.1 g/dL 8.2   6.4   7.2    ?Total Bilirubin 0.3 - 1.2 mg/dL 0.4   0.3   0.4    ?Alkaline Phos 38 - 126 U/L 89      ?AST 15 - 41 U/L '22   20   24    ' ?ALT 0 - 44 U/L '18   12   18    ' ? ? ?Lab Results  ?Component Value Date  ? WBC 11.4 (H) 04/26/2021  ? HGB 13.3 04/26/2021  ? HCT 40.9 04/26/2021  ? MCV 75.3 (L) 04/26/2021  ? PLT 388 04/26/2021  ? NEUTROABS 16.0 (H) 03/13/2021  ? ? ?ASSESSMENT & PLAN:  ?Ductal carcinoma in situ (DCIS) of left breast ?03/06/2021:Left breast calcifications and diagnostic mammogram: 1.1 cm, additional mass at 2:00: 1.7 cm (fibroadenoma).  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  Focal ALH, fibrocystic changes ? ?Pathology counseling: I discussed the final pathology report of the patient provided  a copy of this report. I discussed the margins.  We also discussed the final staging along with previously performed ER/PR testing. ? ?Treatment plan: ?1. adjuvant radiation therapy ?2. Followed by antiestrogen therapy with tamoxifen ?5 years ? ?She requested a Spanish translation of her pathology report.  We will try to find out how that can be done. ? ?Return to clinic after radiation is complete ? ? ? ?No orders of the defined types were placed in this encounter. ? ?The patient has a good understanding of the overall plan. she agrees with it. she will call with any problems that may develop before the next visit here. ?Total time spent: 30 mins including face to face time and time spent for planning, charting and co-ordination of care ? ? Harriette Ohara, MD ?04/29/21 ? ? ? I Gardiner Coins am scribing for Dr. Lindi Adie ? ?I have  reviewed the above documentation for accuracy and completeness, and I agree with the above. ?  ?

## 2021-04-17 NOTE — Anesthesia Postprocedure Evaluation (Signed)
Anesthesia Post Note ? ?Patient: Helen Mccarthy ? ?Procedure(s) Performed: LEFT BREAST LUMPECTOMY WITH RADIOACTIVE SEED LOCALIZATION (Left: Breast) ? ?  ? ?Patient location during evaluation: PACU ?Anesthesia Type: General ?Level of consciousness: awake ?Pain management: pain level controlled ?Vital Signs Assessment: post-procedure vital signs reviewed and stable ?Respiratory status: spontaneous breathing, nonlabored ventilation, respiratory function stable and patient connected to nasal cannula oxygen ?Cardiovascular status: blood pressure returned to baseline and stable ?Postop Assessment: no apparent nausea or vomiting ?Anesthetic complications: no ? ? ?No notable events documented. ? ?Last Vitals:  ?Vitals:  ? 04/17/21 1017 04/17/21 1135  ?BP:  (!) 163/83  ?Pulse: 62 64  ?Resp: 12 16  ?Temp:  36.6 ?C  ?SpO2: 96% 97%  ?  ?Last Pain:  ?Vitals:  ? 04/17/21 1028  ?TempSrc:   ?PainSc: 0-No pain  ? ? ?  ?  ?  ?  ?  ?  ? ?Zakya Halabi P Arye Weyenberg ? ? ? ? ?

## 2021-04-17 NOTE — Transfer of Care (Signed)
Immediate Anesthesia Transfer of Care Note ? ?Patient: Helen Mccarthy ? ?Procedure(s) Performed: LEFT BREAST LUMPECTOMY WITH RADIOACTIVE SEED LOCALIZATION (Left: Breast) ? ?Patient Location: PACU ? ?Anesthesia Type:General ? ?Level of Consciousness: drowsy ? ?Airway & Oxygen Therapy: Patient Spontanous Breathing and Patient connected to face mask oxygen ? ?Post-op Assessment: Report given to RN and Post -op Vital signs reviewed and stable ? ?Post vital signs: Reviewed and stable ? ?Last Vitals:  ?BP: 152/78 (102) ?Vitals Value Taken Time  ?BP    ?Temp    ?Pulse 65 04/17/21 0941  ?Resp 13 04/17/21 0941  ?SpO2 100 % 04/17/21 0941  ?Vitals shown include unvalidated device data. ? ?Last Pain:  ?Vitals:  ? 04/17/21 0715  ?TempSrc: Oral  ?PainSc: 0-No pain  ?   ? ?Patients Stated Pain Goal: 3 (04/17/21 0715) ? ?Complications: No notable events documented. ?

## 2021-04-18 ENCOUNTER — Encounter (HOSPITAL_BASED_OUTPATIENT_CLINIC_OR_DEPARTMENT_OTHER): Payer: Self-pay | Admitting: Surgery

## 2021-04-19 LAB — SURGICAL PATHOLOGY

## 2021-04-21 ENCOUNTER — Encounter: Payer: Self-pay | Admitting: Obstetrics & Gynecology

## 2021-04-22 ENCOUNTER — Encounter (HOSPITAL_BASED_OUTPATIENT_CLINIC_OR_DEPARTMENT_OTHER): Payer: Self-pay | Admitting: Obstetrics & Gynecology

## 2021-04-22 ENCOUNTER — Other Ambulatory Visit: Payer: Self-pay

## 2021-04-22 NOTE — Progress Notes (Addendum)
? ? Your procedure is scheduled on Tuesday, 04/30/21. ? Report to [facility] ? Call this number if you have problems the morning of surgery  :[phone]. ? ? [place] [facility].  WE ARE LOCATED IN THE [facility]. ? ?YOU MUST HAVE A RESPONSIBLE DRIVER AND PERSON TO TAKE CARE OF YOU THE FIRST 24 HOURS AFTER SURGERY. ? ?PLEASE BRING YOUR INSURANCE CARD AND PHOTO ID DAY OF SURGERY. ? ?ONLY 2 PEOPLE ARE ALLOWED IN  WAITING  ROOM.  ?                                   ? REMEMBER: ? DO NOT EAT FOOD, CANDY GUM OR MINTS  AFTER MIDNIGHT THE NIGHT BEFORE YOUR SURGERY . YOU MAY HAVE CLEAR LIQUIDS FROM MIDNIGHT THE NIGHT BEFORE YOUR SURGERY UNTIL  5:30 AM. NO CLEAR LIQUIDS AFTER   5:30 AM DAY OF SURGERY. ? ?YOU MAY  BRUSH YOUR TEETH MORNING OF SURGERY AND RINSE YOUR MOUTH OUT, NO CHEWING GUM CANDY OR MINTS. ? ? ? ? ?CLEAR LIQUID DIET ? ? ?Foods Allowed                                                                     Foods Excluded ? ?Coffee and tea, regular and decaf                             liquids that you cannot  ?Plain Jell-O                                                                   see through such as: ?Fruit ices (not with fruit pulp)                                     milk, soups, orange juice  ?Plain  Popsicles                                    All solid food ?Carbonated beverages, regular and diet                                    ?Cranberry, grape and apple juices ?Sports drinks like Gatorade ? ?Sample Menu ?Breakfast                                Lunch  Supper ?Cranberry juice                                           ?Jell-O                                     Grape juice                           Apple juice ?Coffee or tea                        Jell-O                                      Popsicle ?                                               Coffee or tea                        Coffee or  tea ? ?_____________________________________________________________________ ?  ? ? TAKE THESE MEDICATIONS MORNING OF SURGERY:  Tramadol if needed ? ? ? UP TO 4 VISITORS  MAY VISIT IN THE EXTENDED RECOVERY ROOM UNTIL 800 PM ONLY.  1 VISITOR AGE 12 AND OVER MAY SPEND THE NIGHT AND MUST BE IN EXTENDED RECOVERY ROOM NO LATER THAN 800 PM . YOUR DISCHARGE TIME AFTER YOU SPEND THE NIGHT IS 900 AM THE MORNING AFTER YOUR SURGERY. ? ?YOU MAY PACK A SMALL OVERNIGHT BAG WITH TOILETRIES FOR YOUR OVERNIGHT STAY IF YOU WISH. ? ?YOUR PRESCRIPTION MEDICATIONS WILL BE PROVIDED DURING [place]. ? ? ?                                   ?DO NOT WEAR JEWERLY, MAKE UP. ?DO NOT WEAR LOTIONS, POWDERS, PERFUMES OR NAIL POLISH ON YOUR FINGERNAILS. TOENAIL POLISH IS OK TO WEAR. ?DO NOT SHAVE FOR 48 HOURS PRIOR TO DAY OF SURGERY. ?MEN MAY SHAVE FACE AND NECK. ?CONTACTS, GLASSES, OR DENTURES MAY NOT BE WORN TO SURGERY. ? ?REMEMBER: NO SMOKING, DRUGS OR ALCOHOL FOR 24 HOURS BEFORE YOUR SURGERY. ?                                   ?[facility] IS NOT RESPONSIBLE  FOR ANY BELONGINGS.                                  ?                                  . ?          [facility] - Preparing for Surgery ?Before surgery, you can play an important role.  Because skin is not sterile,  your skin needs to be as free of germs as possible.  You can reduce the number of germs on your skin by washing with CHG (chlorahexidine gluconate) soap before surgery.  CHG is an antiseptic cleaner which kills germs and bonds with the skin to continue killing germs even after washing. ?Please DO NOT use if you have an allergy to CHG or antibacterial soaps.  If your skin becomes reddened/irritated stop using the CHG and inform your nurse when you arrive at Short Stay. ?Do not shave (including legs and underarms) for at least 48 hours prior to the first CHG shower.  You may shave your face/neck. ?Please follow these instructions carefully: ? 1.  Shower with CHG Soap  the night before surgery and the  morning of Surgery. ? 2.  If you choose to wash your hair, wash your hair first as usual with your  normal  shampoo. ? 3.  After you shampoo, rinse your hair and body thoroughly to remove the  shampoo.                            ?4.  Use CHG as you would any other liquid soap.  You can apply chg directly  to the skin and wash  ?                    Gently with a scrungie or clean washcloth. ? 5.  Apply the CHG Soap to your body ONLY FROM THE NECK DOWN.   Do not use on face/ open      ?                     Wound or open sores. Avoid contact with eyes, ears mouth and genitals (private parts).  ?                     Production manager,  Genitals (private parts) with your normal soap. ?            6.  Wash thoroughly, paying special attention to the area where your surgery  will be performed. ? 7.  Thoroughly rinse your body with warm water from the neck down. ? 8.  DO NOT shower/wash with your normal soap after using and rinsing off  the CHG Soap. ?               9.  Pat yourself dry with a clean towel. ?           10.  Wear clean pajamas. ?           11.  Place clean sheets on your bed the night of your first shower and do not  sleep with pets. ?Day of Surgery : ?Do not apply any lotions/deodorants the morning of surgery.  Please wear clean clothes to the hospital/surgery center. ? ?IF YOU HAVE ANY SKIN IRRITATION OR PROBLEMS WITH THE SURGICAL SOAP, PLEASE GET A BAR OF GOLD DIAL SOAP AND SHOWER THE NIGHT BEFORE YOUR SURGERY AND THE MORNING OF YOUR SURGERY. PLEASE LET THE NURSE KNOW MORNING OF YOUR SURGERY IF YOU HAD ANY PROBLEMS WITH THE SURGICAL SOAP. ? ? ?________________________________________________________________________                  ?                                    ?  QUESTIONS CALL Fani Rotondo PRE OP NURSE PHONE [phone].                                    ?

## 2021-04-22 NOTE — Progress Notes (Addendum)
?  Spoke w/ via phone for pre-op interview---[employer], [place], [place] [id number] via [employer]) ?Lab needs dos----urine pregnancy               ?Lab results------04/26/21 lab appt for CBC, type & screen ?COVID test -----patient states asymptomatic no test needed ?Arrive at -------0630 on Tuesday, 04/30/21. ?NPO after MN NO Solid Food.  Clear liquids from MN until---0530 ?Med rec completed ?Medications to take morning of surgery -----Tramadol prn ?Diabetic medication -----n/a ?Patient instructed no nail polish to be worn day of surgery ?Patient instructed to bring photo id and insurance card day of surgery ?Patient aware to have Driver (ride ) / caregiver    for 24 hours after surgery  ?Patient Special Instructions -----Extended recovery instructions given. Written instructions given at pre-op lab appt. Patient stated that she had someone that could read the instructions to her. ?Pre-Op special Istructions -----Spanish interpreter requested for day of surgery. ?Patient verbalized understanding of instructions that were given at this phone interview. ?Patient denies shortness of breath, chest pain, fever, cough at this phone interview.  ? ?I last spoke with patient on 03/11/21. Her health history, allergies and medications were updated at that time. Patient stated no changes in medical history since 03/11/21 except for 04/17/21 breast lumpectomy. ? ? ?

## 2021-04-23 ENCOUNTER — Encounter: Payer: Self-pay | Admitting: *Deleted

## 2021-04-26 ENCOUNTER — Encounter (HOSPITAL_COMMUNITY)
Admission: RE | Admit: 2021-04-26 | Discharge: 2021-04-26 | Disposition: A | Discharge status: Home / Self Care | Payer: Self-pay | Source: Ambulatory Visit | Attending: Obstetrics & Gynecology | Admitting: Obstetrics & Gynecology

## 2021-04-26 DIAGNOSIS — Z01812 Encounter for preprocedural laboratory examination: Secondary | ICD-10-CM | POA: Insufficient documentation

## 2021-04-26 LAB — CBC
HCT: 40.9 % (ref 36.0–46.0)
Hemoglobin: 13.3 g/dL (ref 12.0–15.0)
MCH: 24.5 pg — ABNORMAL LOW (ref 26.0–34.0)
MCHC: 32.5 g/dL (ref 30.0–36.0)
MCV: 75.3 fL — ABNORMAL LOW (ref 80.0–100.0)
Platelets: 388 10*3/uL (ref 150–400)
RBC: 5.43 MIL/uL — ABNORMAL HIGH (ref 3.87–5.11)
RDW: 15.6 % — ABNORMAL HIGH (ref 11.5–15.5)
WBC: 11.4 10*3/uL — ABNORMAL HIGH (ref 4.0–10.5)
nRBC: 0 % (ref 0.0–0.2)

## 2021-04-29 ENCOUNTER — Other Ambulatory Visit: Payer: Self-pay

## 2021-04-29 ENCOUNTER — Encounter: Payer: BC Managed Care – PPO | Admitting: Obstetrics & Gynecology

## 2021-04-29 ENCOUNTER — Inpatient Hospital Stay: Payer: Self-pay | Attending: Hematology and Oncology | Admitting: Hematology and Oncology

## 2021-04-29 DIAGNOSIS — D0512 Intraductal carcinoma in situ of left breast: Secondary | ICD-10-CM | POA: Insufficient documentation

## 2021-04-29 NOTE — Anesthesia Preprocedure Evaluation (Addendum)
Anesthesia Evaluation  ?Patient identified by MRN, date of birth, ID band ?Patient awake ? ? ? ?Reviewed: ?Allergy & Precautions, Patient's Chart, lab work & pertinent test results ? ?Airway ?Mallampati: II ? ?TM Distance: >3 FB ?Neck ROM: Full ? ? ? Dental ?no notable dental hx. ? ?  ?Pulmonary ?neg pulmonary ROS,  ?  ?Pulmonary exam normal ? ? ? ? ? ? ? Cardiovascular ?negative cardio ROS ? ? ?Rhythm:Regular Rate:Normal ? ? ?  ?Neuro/Psych ?negative neurological ROS ? negative psych ROS  ? GI/Hepatic ?negative GI ROS, Neg liver ROS,   ?Endo/Other  ?negative endocrine ROS ? Renal/GU ?negative Renal ROS  ? ?  ?Musculoskeletal ?negative musculoskeletal ROS ?(+)  ? Abdominal ?Normal abdominal exam  (+)   ?Peds ? Hematology ?negative hematology ROS ?(+)   ?Anesthesia Other Findings ?LEFT BREAST DCIS ? Reproductive/Obstetrics ?S/p tubal ligation ? ?  ? ? ? ? ? ? ? ? ? ? ? ? ? ?  ?  ? ? ? ? ? ? ? ?Anesthesia Physical ? ?Anesthesia Plan ? ?ASA: 2 ? ?Anesthesia Plan: General  ? ?Post-op Pain Management: Celebrex PO (pre-op)* and Tylenol PO (pre-op)*  ? ?Induction: Intravenous ? ?PONV Risk Score and Plan: 4 or greater and Ondansetron, Dexamethasone, Midazolam, Treatment may vary due to age or medical condition and Scopolamine patch - Pre-op ? ?Airway Management Planned: LMA and Oral ETT ? ?Additional Equipment: None ? ?Intra-op Plan:  ? ?Post-operative Plan: Extubation in OR ? ?Informed Consent: I have reviewed the patients History and Physical, chart, labs and discussed the procedure including the risks, benefits and alternatives for the proposed anesthesia with the patient or authorized representative who has indicated his/her understanding and acceptance.  ? ? ? ?Dental advisory given and Interpreter used for interveiw ? ?Plan Discussed with: CRNA ? ?Anesthesia Plan Comments:   ? ? ? ? ? ?Anesthesia Quick Evaluation ? ?

## 2021-04-29 NOTE — Assessment & Plan Note (Signed)
03/06/2021:Left breast calcifications and diagnostic mammogram: 1.1 cm, additional mass at 2:00: 1.7 cm (fibroadenoma).  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  Focal ALH, fibrocystic changes ? ?Pathology counseling: I discussed the final pathology report of the patient provided  a copy of this report. I discussed the margins.  We also discussed the final staging along with previously performed ER/PR testing. ? ?Treatment plan: ?1. adjuvant radiation therapy ?2. Followed by antiestrogen therapy with tamoxifen ?5 years ? ?Return to clinic after radiation is complete ?

## 2021-04-30 ENCOUNTER — Encounter (HOSPITAL_BASED_OUTPATIENT_CLINIC_OR_DEPARTMENT_OTHER): Payer: Self-pay | Admitting: Obstetrics & Gynecology

## 2021-04-30 ENCOUNTER — Encounter (HOSPITAL_BASED_OUTPATIENT_CLINIC_OR_DEPARTMENT_OTHER)
Admission: RE | Disposition: A | Discharge status: Home / Self Care | Payer: Self-pay | Source: Home / Self Care | Attending: Obstetrics & Gynecology

## 2021-04-30 ENCOUNTER — Other Ambulatory Visit: Payer: Self-pay

## 2021-04-30 ENCOUNTER — Ambulatory Visit (HOSPITAL_BASED_OUTPATIENT_CLINIC_OR_DEPARTMENT_OTHER): Payer: Self-pay | Admitting: Anesthesiology

## 2021-04-30 ENCOUNTER — Ambulatory Visit (HOSPITAL_BASED_OUTPATIENT_CLINIC_OR_DEPARTMENT_OTHER)
Admission: RE | Admit: 2021-04-30 | Discharge: 2021-05-01 | Disposition: A | Discharge status: Home / Self Care | Payer: Self-pay | Attending: Obstetrics & Gynecology | Admitting: Obstetrics & Gynecology

## 2021-04-30 DIAGNOSIS — N920 Excessive and frequent menstruation with regular cycle: Secondary | ICD-10-CM | POA: Insufficient documentation

## 2021-04-30 DIAGNOSIS — D0512 Intraductal carcinoma in situ of left breast: Secondary | ICD-10-CM

## 2021-04-30 DIAGNOSIS — Z9889 Other specified postprocedural states: Secondary | ICD-10-CM

## 2021-04-30 DIAGNOSIS — Z170 Estrogen receptor positive status [ER+]: Secondary | ICD-10-CM | POA: Insufficient documentation

## 2021-04-30 DIAGNOSIS — N838 Other noninflammatory disorders of ovary, fallopian tube and broad ligament: Secondary | ICD-10-CM | POA: Insufficient documentation

## 2021-04-30 DIAGNOSIS — N888 Other specified noninflammatory disorders of cervix uteri: Secondary | ICD-10-CM | POA: Insufficient documentation

## 2021-04-30 DIAGNOSIS — R102 Pelvic and perineal pain: Secondary | ICD-10-CM | POA: Insufficient documentation

## 2021-04-30 DIAGNOSIS — D219 Benign neoplasm of connective and other soft tissue, unspecified: Secondary | ICD-10-CM

## 2021-04-30 DIAGNOSIS — D259 Leiomyoma of uterus, unspecified: Secondary | ICD-10-CM

## 2021-04-30 DIAGNOSIS — N8003 Adenomyosis of the uterus: Secondary | ICD-10-CM | POA: Insufficient documentation

## 2021-04-30 DIAGNOSIS — Z4002 Encounter for prophylactic removal of ovary: Secondary | ICD-10-CM | POA: Insufficient documentation

## 2021-04-30 HISTORY — PX: HYSTERECTOMY ABDOMINAL WITH SALPINGECTOMY: SHX6725

## 2021-04-30 LAB — TYPE AND SCREEN
ABO/RH(D): O POS
Antibody Screen: NEGATIVE

## 2021-04-30 LAB — POCT PREGNANCY, URINE: Preg Test, Ur: NEGATIVE

## 2021-04-30 SURGERY — HYSTERECTOMY, TOTAL, ABDOMINAL, WITH SALPINGECTOMY
Anesthesia: General | Site: Abdomen | Laterality: Bilateral

## 2021-04-30 MED ORDER — 0.9 % SODIUM CHLORIDE (POUR BTL) OPTIME
TOPICAL | Status: DC | PRN
  Administered 2021-04-30: 2000 mL

## 2021-04-30 MED ORDER — DEXMEDETOMIDINE (PRECEDEX) IN NS 20 MCG/5ML (4 MCG/ML) IV SYRINGE
PREFILLED_SYRINGE | INTRAVENOUS | Status: DC | PRN
Start: 2021-04-30 — End: 2021-04-30
  Administered 2021-04-30: 12 ug via INTRAVENOUS

## 2021-04-30 MED ORDER — MIDAZOLAM HCL 2 MG/2ML IJ SOLN
INTRAMUSCULAR | Status: AC
  Filled 2021-04-30: qty 2

## 2021-04-30 MED ORDER — DEXAMETHASONE SODIUM PHOSPHATE 10 MG/ML IJ SOLN
INTRAMUSCULAR | Status: DC | PRN
  Administered 2021-04-30: 10 mg via INTRAVENOUS

## 2021-04-30 MED ORDER — FENTANYL CITRATE (PF) 100 MCG/2ML IJ SOLN
INTRAMUSCULAR | Status: DC | PRN
  Administered 2021-04-30: 100 ug via INTRAVENOUS
  Administered 2021-04-30 (×4): 50 ug via INTRAVENOUS

## 2021-04-30 MED ORDER — ROCURONIUM BROMIDE 10 MG/ML (PF) SYRINGE
PREFILLED_SYRINGE | INTRAVENOUS | Status: DC | PRN
  Administered 2021-04-30: 60 mg via INTRAVENOUS
  Administered 2021-04-30: 20 mg via INTRAVENOUS

## 2021-04-30 MED ORDER — LIDOCAINE 2% (20 MG/ML) 5 ML SYRINGE
INTRAMUSCULAR | Status: DC | PRN
  Administered 2021-04-30: 60 mg via INTRAVENOUS

## 2021-04-30 MED ORDER — LABETALOL HCL 5 MG/ML IV SOLN
10.0000 mg | Freq: Once | INTRAVENOUS | Status: AC
Start: 2021-04-30 — End: 2021-04-30
  Administered 2021-04-30: 10 mg via INTRAVENOUS

## 2021-04-30 MED ORDER — OXYCODONE-ACETAMINOPHEN 5-325 MG PO TABS
2.0000 | ORAL_TABLET | ORAL | Status: DC | PRN
  Administered 2021-04-30 (×4): 1 via ORAL

## 2021-04-30 MED ORDER — SODIUM CHLORIDE 0.9 % IV SOLN
INTRAVENOUS | Status: DC | PRN
  Administered 2021-04-30: 40 mL

## 2021-04-30 MED ORDER — HEMOSTATIC AGENTS (NO CHARGE) OPTIME
TOPICAL | Status: DC | PRN
  Administered 2021-04-30: 1

## 2021-04-30 MED ORDER — PROPOFOL 10 MG/ML IV BOLUS
INTRAVENOUS | Status: DC | PRN
Start: 2021-04-30 — End: 2021-04-30
  Administered 2021-04-30: 150 mg via INTRAVENOUS

## 2021-04-30 MED ORDER — SCOPOLAMINE 1 MG/3DAYS TD PT72
MEDICATED_PATCH | TRANSDERMAL | Status: AC
Start: 2021-04-30 — End: ?
  Filled 2021-04-30: qty 1

## 2021-04-30 MED ORDER — LABETALOL HCL 5 MG/ML IV SOLN
INTRAVENOUS | Status: AC
  Filled 2021-04-30: qty 4

## 2021-04-30 MED ORDER — HYDROMORPHONE HCL 1 MG/ML IJ SOLN: 0.5000 mg | INTRAMUSCULAR | Status: DC | PRN

## 2021-04-30 MED ORDER — DEXMEDETOMIDINE (PRECEDEX) IN NS 20 MCG/5ML (4 MCG/ML) IV SYRINGE
PREFILLED_SYRINGE | INTRAVENOUS | Status: AC
  Filled 2021-04-30: qty 5

## 2021-04-30 MED ORDER — WHITE PETROLATUM EX OINT
TOPICAL_OINTMENT | CUTANEOUS | Status: AC
  Filled 2021-04-30: qty 5

## 2021-04-30 MED ORDER — CELECOXIB 200 MG PO CAPS
200.0000 mg | ORAL_CAPSULE | Freq: Once | ORAL | Status: AC
Start: 2021-04-30 — End: 2021-04-30
  Administered 2021-04-30: 200 mg via ORAL

## 2021-04-30 MED ORDER — BUPIVACAINE HCL (PF) 0.25 % IJ SOLN
INTRAMUSCULAR | Status: DC | PRN
  Administered 2021-04-30: 20 mL

## 2021-04-30 MED ORDER — LACTATED RINGERS IV SOLN: INTRAVENOUS | Status: DC

## 2021-04-30 MED ORDER — HYDROCODONE-ACETAMINOPHEN 5-325 MG PO TABS
1.0000 | ORAL_TABLET | ORAL | Status: DC | PRN
  Administered 2021-04-30 (×2): 1 via ORAL

## 2021-04-30 MED ORDER — ONDANSETRON HCL 4 MG/2ML IJ SOLN
INTRAMUSCULAR | Status: DC | PRN
  Administered 2021-04-30: 4 mg via INTRAVENOUS

## 2021-04-30 MED ORDER — FENTANYL CITRATE (PF) 100 MCG/2ML IJ SOLN
INTRAMUSCULAR | Status: AC
  Filled 2021-04-30: qty 2

## 2021-04-30 MED ORDER — FENTANYL CITRATE (PF) 100 MCG/2ML IJ SOLN
25.0000 ug | INTRAMUSCULAR | Status: DC | PRN
  Administered 2021-04-30 (×2): 25 ug via INTRAVENOUS
  Administered 2021-04-30: 50 ug via INTRAVENOUS

## 2021-04-30 MED ORDER — PROPOFOL 10 MG/ML IV BOLUS
INTRAVENOUS | Status: AC
  Filled 2021-04-30: qty 20

## 2021-04-30 MED ORDER — HYDROCODONE-ACETAMINOPHEN 5-325 MG PO TABS
ORAL_TABLET | ORAL | Status: AC
  Filled 2021-04-30: qty 1

## 2021-04-30 MED ORDER — ONDANSETRON HCL 4 MG/2ML IJ SOLN
INTRAMUSCULAR | Status: AC
  Filled 2021-04-30: qty 2

## 2021-04-30 MED ORDER — CEFAZOLIN SODIUM-DEXTROSE 2-4 GM/100ML-% IV SOLN
2.0000 g | INTRAVENOUS | Status: AC
  Administered 2021-04-30: 2 g via INTRAVENOUS

## 2021-04-30 MED ORDER — CELECOXIB 200 MG PO CAPS
ORAL_CAPSULE | ORAL | Status: AC
  Filled 2021-04-30: qty 1

## 2021-04-30 MED ORDER — OXYCODONE-ACETAMINOPHEN 5-325 MG PO TABS
ORAL_TABLET | ORAL | Status: AC
  Filled 2021-04-30: qty 1

## 2021-04-30 MED ORDER — SCOPOLAMINE 1 MG/3DAYS TD PT72
1.0000 | MEDICATED_PATCH | TRANSDERMAL | Status: DC
  Administered 2021-04-30: 1.5 mg via TRANSDERMAL

## 2021-04-30 MED ORDER — FENTANYL CITRATE (PF) 250 MCG/5ML IJ SOLN
INTRAMUSCULAR | Status: AC
  Filled 2021-04-30: qty 5

## 2021-04-30 MED ORDER — SUGAMMADEX SODIUM 200 MG/2ML IV SOLN
INTRAVENOUS | Status: DC | PRN
  Administered 2021-04-30: 200 mg via INTRAVENOUS

## 2021-04-30 MED ORDER — HYDROCODONE-ACETAMINOPHEN 5-325 MG PO TABS
ORAL_TABLET | ORAL | Status: AC
  Filled 2021-04-30: qty 2

## 2021-04-30 MED ORDER — ACETAMINOPHEN 500 MG PO TABS
1000.0000 mg | ORAL_TABLET | Freq: Once | ORAL | Status: AC
Start: 2021-04-30 — End: 2021-04-30
  Administered 2021-04-30: 1000 mg via ORAL

## 2021-04-30 MED ORDER — ROCURONIUM BROMIDE 10 MG/ML (PF) SYRINGE
PREFILLED_SYRINGE | INTRAVENOUS | Status: AC
  Filled 2021-04-30: qty 10

## 2021-04-30 MED ORDER — HYDROMORPHONE HCL 1 MG/ML IJ SOLN
INTRAMUSCULAR | Status: DC | PRN
  Administered 2021-04-30: 1 mg via INTRAVENOUS

## 2021-04-30 MED ORDER — ACETAMINOPHEN 325 MG PO TABS: 650.0000 mg | ORAL_TABLET | ORAL | Status: DC | PRN

## 2021-04-30 MED ORDER — ACETAMINOPHEN 500 MG PO TABS
ORAL_TABLET | ORAL | Status: AC
  Filled 2021-04-30: qty 2

## 2021-04-30 MED ORDER — MIDAZOLAM HCL 5 MG/5ML IJ SOLN
INTRAMUSCULAR | Status: DC | PRN
Start: 2021-04-30 — End: 2021-04-30
  Administered 2021-04-30: 2 mg via INTRAVENOUS

## 2021-04-30 MED ORDER — POVIDONE-IODINE 10 % EX SWAB: 2.0000 "application " | Freq: Once | CUTANEOUS | Status: DC

## 2021-04-30 MED ORDER — HYDROMORPHONE HCL 2 MG/ML IJ SOLN
INTRAMUSCULAR | Status: AC
  Filled 2021-04-30: qty 1

## 2021-04-30 MED ORDER — AMISULPRIDE (ANTIEMETIC) 5 MG/2ML IV SOLN: 10.0000 mg | Freq: Once | INTRAVENOUS | Status: DC | PRN

## 2021-04-30 MED ORDER — DEXAMETHASONE SODIUM PHOSPHATE 10 MG/ML IJ SOLN
INTRAMUSCULAR | Status: AC
  Filled 2021-04-30: qty 1

## 2021-04-30 MED ORDER — LABETALOL HCL 5 MG/ML IV SOLN
INTRAVENOUS | Status: DC | PRN
  Administered 2021-04-30 (×2): 2.5 mg via INTRAVENOUS
  Administered 2021-04-30: 5 mg via INTRAVENOUS

## 2021-04-30 MED ORDER — OXYCODONE-ACETAMINOPHEN 5-325 MG PO TABS
ORAL_TABLET | ORAL | Status: AC
  Filled 2021-04-30: qty 2

## 2021-04-30 MED ORDER — CEFAZOLIN SODIUM-DEXTROSE 2-4 GM/100ML-% IV SOLN
INTRAVENOUS | Status: AC
  Filled 2021-04-30: qty 100

## 2021-04-30 SURGICAL SUPPLY — 44 items
ADH SKN CLS APL DERMABOND .7 (GAUZE/BANDAGES/DRESSINGS) ×1
BAG DECANTER FOR FLEXI CONT (MISCELLANEOUS) ×1 IMPLANT
BLADE SURG 11 STRL SS (BLADE) ×2 IMPLANT
BLADE SURG 15 STRL LF DISP TIS (BLADE) IMPLANT
BLADE SURG 15 STRL SS (BLADE) ×2
DECANTER SPIKE VIAL GLASS SM (MISCELLANEOUS) ×1 IMPLANT
DERMABOND ADVANCED (GAUZE/BANDAGES/DRESSINGS) ×1
DERMABOND ADVANCED .7 DNX12 (GAUZE/BANDAGES/DRESSINGS) IMPLANT
DRAPE UTILITY XL STRL (DRAPES) ×2 IMPLANT
DRAPE WARM FLUID 44X44 (DRAPES) ×2 IMPLANT
DRSG OPSITE POSTOP 4X10 (GAUZE/BANDAGES/DRESSINGS) ×2 IMPLANT
DURAPREP 26ML APPLICATOR (WOUND CARE) ×2 IMPLANT
GAUZE 4X4 16PLY ~~LOC~~+RFID DBL (SPONGE) ×2 IMPLANT
GLOVE BIO SURGEON STRL SZ 6.5 (GLOVE) ×2 IMPLANT
GLOVE BIO SURGEON STRL SZ8 (GLOVE) ×2 IMPLANT
GLOVE BIOGEL PI IND STRL 7.0 (GLOVE) ×1 IMPLANT
GLOVE BIOGEL PI IND STRL 8 (GLOVE) ×1 IMPLANT
GLOVE BIOGEL PI INDICATOR 7.0 (GLOVE) ×5
GLOVE BIOGEL PI INDICATOR 8 (GLOVE) ×1
GLOVE SURG ENC MOIS LTX SZ6.5 (GLOVE) ×1 IMPLANT
GLOVE SURG ENC MOIS LTX SZ7 (GLOVE) ×2 IMPLANT
GOWN STRL REUS W/TWL LRG LVL3 (GOWN DISPOSABLE) ×2 IMPLANT
GOWN STRL REUS W/TWL XL LVL3 (GOWN DISPOSABLE) ×2 IMPLANT
HEMOSTAT ARISTA ABSORB 3G PWDR (HEMOSTASIS) ×1 IMPLANT
HIBICLENS CHG 4% 4OZ BTL (MISCELLANEOUS) ×2 IMPLANT
HOLDER FOLEY CATH W/STRAP (MISCELLANEOUS) ×1 IMPLANT
KIT TURNOVER CYSTO (KITS) ×2 IMPLANT
LIGASURE IMPACT 36 18CM CVD LR (INSTRUMENTS) ×2 IMPLANT
NEEDLE HYPO 22GX1.5 SAFETY (NEEDLE) ×4 IMPLANT
PACK ABDOMINAL GYN (CUSTOM PROCEDURE TRAY) ×2 IMPLANT
PAD OB MATERNITY 4.3X12.25 (PERSONAL CARE ITEMS) ×2 IMPLANT
PROTECTOR NERVE ULNAR (MISCELLANEOUS) ×2 IMPLANT
RTRCTR C-SECT PINK 34CM XLRG (MISCELLANEOUS) ×1 IMPLANT
SPONGE T-LAP 18X18 ~~LOC~~+RFID (SPONGE) ×5 IMPLANT
SUT MNCRL AB 4-0 PS2 18 (SUTURE) ×2 IMPLANT
SUT PLAIN 2 0 XLH (SUTURE) ×2 IMPLANT
SUT VIC AB 0 CT1 27 (SUTURE) ×14
SUT VIC AB 0 CT1 27XBRD ANBCTR (SUTURE) ×4 IMPLANT
SUT VIC AB 0 CT1 27XCR 8 STRN (SUTURE) ×2 IMPLANT
SUT VIC AB 2-0 CTB1 (SUTURE) ×1 IMPLANT
SUT VICRYL 0 TIES 12 18 (SUTURE) ×2 IMPLANT
SYR CONTROL 10ML LL (SYRINGE) ×4 IMPLANT
TOWEL OR 17X26 10 PK STRL BLUE (TOWEL DISPOSABLE) ×2 IMPLANT
TRAY FOLEY W/BAG SLVR 14FR LF (SET/KITS/TRAYS/PACK) ×2 IMPLANT

## 2021-04-30 NOTE — Anesthesia Procedure Notes (Signed)
Procedure Name: Intubation ?Date/Time: 04/30/2021 8:38 AM ?Performed by: Rogers Blocker, CRNA ?Pre-anesthesia Checklist: Patient identified, Emergency Drugs available, Suction available and Patient being monitored ?Patient Re-evaluated:Patient Re-evaluated prior to induction ?Oxygen Delivery Method: Circle System Utilized ?Preoxygenation: Pre-oxygenation with 100% oxygen ?Induction Type: IV induction ?Ventilation: Mask ventilation without difficulty ?Laryngoscope Size: Mac and 3 ?Grade View: Grade I ?Tube type: Oral ?Tube size: 7.0 mm ?Number of attempts: 1 ?Airway Equipment and Method: Stylet, Oral airway and Bite block ?Placement Confirmation: ETT inserted through vocal cords under direct vision, positive ETCO2 and breath sounds checked- equal and bilateral ?Secured at: 21 cm ?Tube secured with: Tape ?Dental Injury: Teeth and Oropharynx as per pre-operative assessment  ? ? ? ? ?

## 2021-04-30 NOTE — Anesthesia Postprocedure Evaluation (Signed)
Anesthesia Post Note ? ?Patient: Kerah Hardebeck ? ?Procedure(s) Performed: TOTAL ABDOMINAL HYSTERECTOMY WITH BILATERAL SALPINGECTOMY and oophorectomy (Bilateral: Abdomen) ? ?  ? ?Patient location during evaluation: PACU ?Anesthesia Type: General ?Level of consciousness: awake and alert ?Pain management: pain level controlled ?Vital Signs Assessment: post-procedure vital signs reviewed and stable ?Respiratory status: spontaneous breathing, nonlabored ventilation, respiratory function stable and patient connected to nasal cannula oxygen ?Cardiovascular status: blood pressure returned to baseline and stable ?Postop Assessment: no apparent nausea or vomiting ?Anesthetic complications: no ? ? ?No notable events documented. ? ?Last Vitals:  ?Vitals:  ? 04/30/21 1215 04/30/21 1230  ?BP: (!) 160/86 (!) 161/88  ?Pulse: 71 71  ?Resp: 13 14  ?Temp:  (!) 36.2 ?C  ?SpO2: 94% 95%  ?  ?Last Pain:  ?Vitals:  ? 04/30/21 1230  ?TempSrc:   ?PainSc: 2   ? ? ?  ?  ?  ?  ?  ?  ? ?March Rummage [place] Dowd ? ? ? ? ?

## 2021-04-30 NOTE — H&P (Addendum)
?Helen Mccarthy is an 51 y.o. female. [id number]  ?  ?RP: TAH/BSO for large Fibroids ?  ?HPI:  Large symptomatic Uterine Fibroids. 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  LN Neg 03/06/2021. Left Lumpectomy 04/17/2021, no residual DCIS on patho.   ? ?Pertinent Gynecological History: ?Menses: Heavy ?Contraception: tubal ligation ?Blood transfusions: none ?Previous GYN Procedures: TL ?Last pap: normal  ? ?Menstrual History: ?Patient's last menstrual period was 04/17/2021 (exact date). ?  ? ?Past Medical History:  ?Diagnosis Date  ? Anemia   ? heavy menstrual bleeding and fibroids  ? Breast cancer ([facility]) 02/2021  ? ductal carcinoma in situ left breast  ? Elevated TSH   ? Endometrial mass   ? Family history of breast cancer 03/13/2021  ? History of PID   ? Hyperlipidemia   ? Menorrhagia   ? Uterine fibroid   ? ? ?Past Surgical History:  ?Procedure Laterality Date  ? BREAST BIOPSY Left 03/01/2021  ? BREAST BIOPSY Left 03/06/2021  ? BREAST LUMPECTOMY WITH RADIOACTIVE SEED LOCALIZATION Left 04/17/2021  ? Procedure: LEFT BREAST LUMPECTOMY WITH RADIOACTIVE SEED LOCALIZATION;  Surgeon: Coralie Keens, MD;  Location: [facility];  Service: General;  Laterality: Left;  ? CESAREAN SECTION    ? X4  ? DILATATION & CURETTAGE/HYSTEROSCOPY WITH MYOSURE N/A 08/28/2017  ? Procedure: [place];  Surgeon: [name], MD;  Location: [facility];  Service: Gynecology;  Laterality: N/A;  requests 7:30am OR time in [place] block ?requests XL Myosure  ?requests one hour OR time  ? KNEE SURGERY Right   ? SHOULDER SURGERY Right   ? TUBAL LIGATION    ? ? ?Family History  ?Problem Relation Age of Onset  ? Hypertension Mother   ? Hypertension Father   ? Breast cancer Maternal Aunt   ?     dx after 50  ? Diabetes Paternal Aunt   ? Diabetes Paternal Uncle   ? ? ?Social History:  reports that she has never smoked. She has never  used smokeless tobacco. She reports current alcohol use. She reports that she does not use drugs. ? ?Allergies: No Known Allergies ? ?Medications Prior to Admission  ?Medication Sig Dispense Refill Last Dose  ? Ascorbic Acid (VITAMIN C PO) Take by mouth.   Past Week  ? calcium carbonate (OSCAL) 1500 (600 Ca) MG TABS tablet Take by mouth 2 (two) times daily with a meal.   Past Week  ? GARLIC OIL PO Take by mouth.   Past Week  ? Glucosamine HCl (GLUCOSAMINE PO) Take by mouth.   Past Week  ? Multiple Vitamin (MULTIVITAMIN) tablet Take 1 tablet by mouth daily.   Past Week  ? Omega-3 1400 MG CAPS Take by mouth.   Past Week  ? VITAMIN D PO Take by mouth.   Past Week  ? VITAMIN E PO Take by mouth.   Past Week  ? traMADol (ULTRAM) 50 MG tablet Take 1 tablet (50 mg total) by mouth every 6 (six) hours as needed. 25 tablet 0 04/28/2021  ? ? ?REVIEW OF SYSTEMS: A ROS was performed and pertinent positives and negatives are included in the history. ? GENERAL: No fevers or chills. HEENT: No change in vision, no earache, sore throat or sinus congestion. NECK: No pain or stiffness. CARDIOVASCULAR: No chest pain or pressure. No palpitations. PULMONARY: No shortness of breath, cough or wheeze. GASTROINTESTINAL: No abdominal pain, nausea, vomiting or diarrhea, melena  or bright red blood per rectum. GENITOURINARY: No urinary frequency, urgency, hesitancy or dysuria. MUSCULOSKELETAL: No joint or muscle pain, no back pain, no recent trauma. DERMATOLOGIC: No rash, no itching, no lesions. ENDOCRINE: No polyuria, polydipsia, no heat or cold intolerance. No recent change in weight. HEMATOLOGICAL: No anemia or easy bruising or bleeding. NEUROLOGIC: No headache, seizures, numbness, tingling or weakness. PSYCHIATRIC: No depression, no loss of interest in normal activity or change in sleep pattern.  ? ? ? ?Blood pressure (!) 154/70, pulse 64, temperature 98.7 ?F (37.1 ?C), temperature source Oral, resp. rate 15, height '4\' 11"'$  (1.499 m), weight  72.1 kg, last menstrual period 04/17/2021, SpO2 100 %. ? ?Physical Exam: ? ?RCR ?Lungs clear ? ?Results for orders placed or performed during the hospital encounter of 04/30/21 (from the past 24 hour(s))  ?Pregnancy, urine POC     Status: None  ? Collection Time: 04/30/21  6:36 AM  ?Result Value Ref Range  ? Preg Test, Ur NEGATIVE NEGATIVE  ? ?Pelvic US 02/12/2021: Comparison is made with previous scan in 2019. Transabdominal scan.  Grossly enlarged uterus with large fibroid to the right fundus measured at more than 12 x 8 cm.  Both uterus and fibroids are increased in size since previous scan.  The large fibroid is very tender to palpation.  3 other smaller fibroids are present to the left fundus.  The overall uterine size is measured at 18.73 x 13.66 x 10.5 cm.  The volume is estimated at 1406.6 cm cube.  The endometrial lining is symmetrical measured at 7.8 mm with no thickening or mass seen.  Both ovaries are identified and normal in size with sparse follicles.  No free fluid in the pelvis. ?  ?  ?Assessment/Plan:  51 y.o. [id number]  ?  ?1. Fibroids ?Large symptomatic Uterine Fibroids. Will proceed with a TAH/BSO. 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  Surgery and risks thoroughly reviewed.  Preop preparation and postop expectations and precautions reviewed.  ?  ?2. Right lower quadrant pain ?Probably d/t pressure from large fibroids. ?  ?3. 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  LN Neg 03/06/2021.  Left Lumpectomy 04/17/2021, patho with no residual DCIS.   ?  ?                      Patient was counseled as to the risk of surgery to include the following: ?  ?1. Infection (prohylactic antibiotics will be administered) ?  ?2. DVT/Pulmonary Embolism (prophylactic pneumo compression stockings will be used) ?  ?3.Trauma to internal organs requiring additional surgical  procedure to repair any injury to internal organs requiring perhaps additional hospitalization days. ?  ?4.Hemmorhage requiring transfusion and blood products which carry risks such as anaphylactic reaction, hepatitis and AIDS ?  ?Patient had received literature information on the procedure scheduled and all her questions were answered and fully accepts all risk. ?[name] ?04/30/2021, 8:19 AM ?  ?

## 2021-04-30 NOTE — Op Note (Signed)
Operative Note ? ?04/30/2021 ? ?11:03 AM ? ?PATIENT:  Helen Mccarthy  51 y.o. female ? ?PRE-OPERATIVE DIAGNOSIS:  Fibroids, Menorhhagia with regular cycles, right pelvic pain, DCIS left breast ? ?POST-OPERATIVE DIAGNOSIS:  Fibroids, Menorhhagia with regular cycles, right pelvic pain, DCIS left breast. ? ?PROCEDURE:  Procedure(s): ?TOTAL ABDOMINAL HYSTERECTOMY WITH BILATERAL SALPINGECTOMY AND BILATERAL OOPHORECTOMY.   ? ?SURGEON:  Surgeon(s): ?[name], MD ? ?ASSISTANT: Arrie Aran RNFA ? ?ANESTHESIA:   general ? ?FINDINGS: Large uterus with many large fibroids and probable adenomyosis.  Bilateral tubes status post tubal ligation.  Bilateral ovaries normal. ? ?DESCRIPTION OF OPERATION: Under general anesthesia with endotracheal intubation, the patient is in decubitus dorsal position.  The patient is prepped with DuraPrep on the abdomen and with Betadine on the suprapubic and vulvar areas.  The Foley is put in place in the bladder.  The patient is draped as usual.  The skin is marked with a surgical pen and infiltrated with Marcaine one quarter plain at the site of a previous C-section scar for a Pfannenstiel incision that is prolonged a few centimeters on each side.  A Pfannenstiel incision is done with the scalpel.  The adipose tissue was opened with the electrocautery.  The aponeurosis is opened transversely with the electrocautery and the Mayo scissors.  The aponeurosis is separated from the recti muscles superiorly and inferiorly.  The parietal peritoneum is open with Metzenbaums scissors under direct vision longitudinally.  The bladder is retracted downward.  Inspection of the abdomen reveals no abnormality.  Except for the large fibroid uterus which is about 18 cm in longitudinal diameter.  The uterus is exteriorized.  The tubes are post bilateral tubal ligation.  Both ovaries are normal in size and appearance.  An Alexis retractor is put in place.  Kelly clamps are applied at each cornua of  the tubes and lateral side of the uterus superiorly.  We started on the right side cauterizing the right infundibulopelvic ligament with the LigaSure and sectioning.  We follow the lower border of the right ovary cauterizing and sectioning.  We proceeded the same way on the left side.  We then cauterized and sectioned the right round ligament and added a Vicryl 0 suture distally.  We proceeded the same way on the left side.  We then opened the visceral peritoneum anteriorly with Metzenbaums scissors and descended the bladder past the cervix.  We used Metzenbaums scissors to dissect the uterine artery bilaterally.  The backflow was clamped with a straight Haney on the right and another straight Heaney was applied on the right uterine artery.  We cut with Mayo scissors and sutured with a Vicryl 0.  A second Vicryl 0 suture was applied on the right uterine artery.  We proceeded the same way on the left side.  The right uterosacral ligament was then clamped with a curved Heaney, sectioned with Mayo scissors and sutured with a Vicryl 0.  We kept that on a hemostat.  We proceeded the same way on the left side. We sectioned the lower aspect of the uterus just above the sutured uterine arteries to remove the bulky uterus and facilitate the rest of the surgery. Cokers were applied anteriorly and posteriorly on the upper cervix.  We then clamped the lateral side of the cervix on the right with a straight Heaney, sectioned with the scalpel and we sutured with a Vicryl 0. We then clamped the angle of the vagina with a curved Heaney on each side, sectioned with Mayo  scissors and sutured with a Heaney stitch of Vicryl 0 on each side which were kept on straight hemostats.  The vagina was then section close to the cervix anteriorly and posteriorly to completely detach the cervix.  The cervix, uterus, bilateral tubes and bilateral ovaries were sent to pathology.  We then used figures of 8 with Vicryl 0 to close the vagina.  The  uterosacral ligaments were attached to the angle of the vagina on its respective side.  Hemostasis was verified and completed with the electrocautery where necessary.  Removal of the Alexis retractor.  Irrigation of the pelvic cavity.  Good hemostasis confirmed.  Closure of the parietal peritoneum with a running suture of Vicryl 2 0.  Closure of the aponeurosis with 2 half running sutures of Vicryl 0.  Plain 3 oh to reapproximate the adipose tissue.  A subcuticular suture of Vicryl 4-0 to close the skin.  Dermabond added.  A honeycomb dressing was applied.  The patient was brought to recovery room in good and stable status. ? ? ?ESTIMATED BLOOD LOSS: 200 mL ? ? ?Intake/Output Summary (Last 24 hours) at 04/30/2021 1103 ?Last data filed at 04/30/2021 1037 ?Gross per 24 hour  ?Intake 1000 ml  ?Output 500 ml  ?Net 500 ml  ?  ? ?BLOOD ADMINISTERED:none  ? ?LOCAL MEDICATIONS USED:  Marcaine and Experel ? ?SPECIMEN:  Source of Specimen:  Uterus with cervix, bilateral ovaries and tubes. ? ?DISPOSITION OF SPECIMEN:  PATHOLOGY ? ?COUNTS:  YES ? ?PLAN OF CARE: Transfer to PACU ? ?[name] LavoieMD11:03 AM ? ? ?   ?

## 2021-04-30 NOTE — Transfer of Care (Signed)
Immediate Anesthesia Transfer of Care Note ? ?Patient: Helen Mccarthy ? ?Procedure(s) Performed: TOTAL ABDOMINAL HYSTERECTOMY WITH BILATERAL SALPINGECTOMY and oophorectomy (Bilateral: Abdomen) ? ?Patient Location: PACU ? ?Anesthesia Type:General ? ?Level of Consciousness: drowsy and responds to stimulation ? ?Airway & Oxygen Therapy: Patient Spontanous Breathing and Patient connected to face mask oxygen ? ?Post-op Assessment: Report given to RN and Post -op Vital signs reviewed and stable ? ?Post vital signs: Reviewed and stable ? ?Last Vitals:  ?Vitals Value Taken Time  ?BP 158/81 04/30/21 1115  ?Temp    ?Pulse 75 04/30/21 1118  ?Resp 9 04/30/21 1118  ?SpO2 95 % 04/30/21 1118  ?Vitals shown include unvalidated device data. ? ?Last Pain:  ?Vitals:  ? 04/30/21 0654  ?TempSrc: Oral  ?PainSc: 2   ?   ? ?Patients Stated Pain Goal: 3 (04/30/21 0654) ? ?Complications: No notable events documented. ?

## 2021-05-01 ENCOUNTER — Encounter (HOSPITAL_BASED_OUTPATIENT_CLINIC_OR_DEPARTMENT_OTHER): Payer: Self-pay | Admitting: Obstetrics & Gynecology

## 2021-05-01 LAB — CBC
HCT: 38.8 % (ref 36.0–46.0)
Hemoglobin: 12.9 g/dL (ref 12.0–15.0)
MCH: 24.7 pg — ABNORMAL LOW (ref 26.0–34.0)
MCHC: 33.2 g/dL (ref 30.0–36.0)
MCV: 74.2 fL — ABNORMAL LOW (ref 80.0–100.0)
Platelets: 382 10*3/uL (ref 150–400)
RBC: 5.23 MIL/uL — ABNORMAL HIGH (ref 3.87–5.11)
RDW: 15.4 % (ref 11.5–15.5)
WBC: 20.2 10*3/uL — ABNORMAL HIGH (ref 4.0–10.5)
nRBC: 0 % (ref 0.0–0.2)

## 2021-05-01 MED ORDER — OXYCODONE-ACETAMINOPHEN 7.5-325 MG PO TABS: 1.0000 | ORAL_TABLET | Freq: Four times a day (QID) | ORAL | 0 refills | Status: AC | PRN

## 2021-05-01 MED ORDER — OXYCODONE-ACETAMINOPHEN 5-325 MG PO TABS
ORAL_TABLET | ORAL | Status: AC
  Filled 2021-05-01: qty 1

## 2021-05-01 NOTE — Progress Notes (Signed)
TAH/BSO POD #1 ? ?Subjective: ?Patient reports tolerating PO, + flatus, and no problems voiding.   ? ?Objective: ?I have reviewed patient's vital signs.  vital signs, intake and output, medications, and labs. ? ?Vitals:  ? 05/01/21 0415 05/01/21 0910  ?BP: (!) 145/82 (!) 149/78  ?Pulse: 81 73  ?Resp: 14 16  ?Temp: 98.8 ?F (37.1 ?C) 98.5 ?F (36.9 ?C)  ?SpO2: 96% 97%  ? ?I/O last 3 completed shifts: ?In: 2732.5 [P.O.:720; I.V.:1937.5; Other:75] ?Out: 4300 [Urine:4100; Blood:200] ?No intake/output data recorded. ? ?Results for orders placed or performed during the hospital encounter of 04/30/21 (from the past 24 hour(s))  ?CBC     Status: Abnormal  ? Collection Time: 05/01/21 12:18 AM  ?Result Value Ref Range  ? WBC 20.2 (H) 4.0 - 10.5 K/uL  ? RBC 5.23 (H) 3.87 - 5.11 MIL/uL  ? Hemoglobin 12.9 12.0 - 15.0 g/dL  ? HCT 38.8 36.0 - 46.0 %  ? MCV 74.2 (L) 80.0 - 100.0 fL  ? MCH 24.7 (L) 26.0 - 34.0 pg  ? MCHC 33.2 30.0 - 36.0 g/dL  ? RDW 15.4 11.5 - 15.5 %  ? Platelets 382 150 - 400 K/uL  ? nRBC 0.0 0.0 - 0.2 %  ? ? ?EXAM ?General: alert, cooperative, and appears stated age ?Resp: clear to auscultation bilaterally ?Cardio: regular rate and rhythm ?GI: Normal.  Soft, not distended.  BS present.  Incision:  Dressing dry. ?Extremities: no edema, redness or tenderness in the calves or thighs ?Vaginal Bleeding: none ? ?Assessment: ?s/p Procedure(s): ?TOTAL ABDOMINAL HYSTERECTOMY WITH BILATERAL SALPINGECTOMY and oophorectomy: stable, progressing well, and tolerating diet ? ?Plan: ?Discharge home ? LOS: 0 days  ? ? ?[name], MD 05/01/2021 9:17 AM ? ?  ? ?  ?

## 2021-05-02 ENCOUNTER — Encounter (HOSPITAL_COMMUNITY): Payer: Self-pay

## 2021-05-02 LAB — SURGICAL PATHOLOGY

## 2021-05-13 ENCOUNTER — Telehealth: Payer: Self-pay

## 2021-05-13 NOTE — Progress Notes (Signed)
?Radiation Oncology         [phone] ?________________________________ ? ?Name: Helen Mccarthy        MRN: [id number]  ?Date of Service: 05/14/2021 DOB: 1970/01/26 ? ?ZY:SAYTKZ, Truddie Hidden, MD  Nicholas Lose, MD    ? ?REFERRING PHYSICIAN: Nicholas Lose, MD ? ? ?DIAGNOSIS: The encounter diagnosis was Ductal carcinoma in situ (DCIS) of left breast. ? ? ?HISTORY OF PRESENT ILLNESS: Helen Mccarthy is a 51 y.o. female originally seen in the multidisciplinary breast clinic for a new diagnosis of left breast cancer. The patient was noted to have diagnostic follow up after having known calcifications in the left breast. Her follow up imaging showed a group of calcifications in the left breast measuring up to 1.1 cm in the outer quadrants, and a separate lesion  in the 2:00 position measuring up to 1.7 cm. There was one borderline appearing lymph node in the left axilla. She underwent biopsy on 03/01/20 of the 2:00 position that showed flat epithelial neoplasm favor benign adenoma. The left upper outer breast showed intermediate grade DCIS, cribriform type with necrosis. The cancer was ER/PR positive. Another left biopsy was consistent with fibroepithelial neoplasm favor fibroadenoma. Her node also biopsied and was negative on 03/06/21.  ? ?Since her last visit the patient has undergone a left lumpectomy on 04/17/2021, and final pathology showed focal atypical lobular hyperplasia without residual ductal carcinoma.  She also underwent TAH/BSO with Dr. Dellis Filbert on 04/30/2021 given a history of uterine fibroids and dysfunctional uterine bleeding.  Her final pathology from that procedure showed a secretory endometrium without hyperplasia or malignancy, adenomyosis leiomyoma, and benign tubes and ovaries.  She is seen today to discuss adjuvant radiotherapy for her breast cancer. ? ?PREVIOUS RADIATION THERAPY: No ? ? ?PAST MEDICAL HISTORY:  ?Past Medical History:  ?Diagnosis Date  ? Anemia   ? heavy menstrual  bleeding and fibroids  ? Breast cancer ([place]) 02/2021  ? ductal carcinoma in situ left breast  ? Elevated TSH   ? Endometrial mass   ? Family history of breast cancer 03/13/2021  ? History of PID   ? Hyperlipidemia   ? Menorrhagia   ? Uterine fibroid   ?   ? ? ?PAST SURGICAL HISTORY: ?Past Surgical History:  ?Procedure Laterality Date  ? BREAST BIOPSY Left 03/01/2021  ? BREAST BIOPSY Left 03/06/2021  ? BREAST LUMPECTOMY WITH RADIOACTIVE SEED LOCALIZATION Left 04/17/2021  ? Procedure: LEFT BREAST LUMPECTOMY WITH RADIOACTIVE SEED LOCALIZATION;  Surgeon: Coralie Keens, MD;  Location: [facility];  Service: General;  Laterality: Left;  ? CESAREAN SECTION    ? X4  ? DILATATION & CURETTAGE/HYSTEROSCOPY WITH MYOSURE N/A 08/28/2017  ? Procedure: [place];  Surgeon: [name], MD;  Location: [facility];  Service: Gynecology;  Laterality: N/A;  requests 7:30am OR time in [place] block ?requests XL Myosure  ?requests one hour OR time  ? HYSTERECTOMY ABDOMINAL WITH SALPINGECTOMY Bilateral 04/30/2021  ? Procedure: TOTAL ABDOMINAL HYSTERECTOMY WITH BILATERAL SALPINGECTOMY and oophorectomy;  Surgeon: [name], MD;  Location: [facility];  Service: Gynecology;  Laterality: Bilateral;  ? KNEE SURGERY Right   ? SHOULDER SURGERY Right   ? TUBAL LIGATION    ? ? ? ?FAMILY HISTORY:  ?Family History  ?Problem Relation Age of Onset  ? Hypertension Mother   ? Hypertension Father   ? Breast cancer Maternal Aunt   ?     dx after 50  ? Diabetes  Paternal Aunt   ? Diabetes Paternal Uncle   ? ? ? ?SOCIAL HISTORY:  reports that she has never smoked. She has never used smokeless tobacco. She reports current alcohol use. She reports that she does not use drugs. The patient is married and lives in [place], [place]. She works in [place]. She is accompanied by her husband and our translator services are accessed via [place]. ? ? ?ALLERGIES:  Patient has no known allergies. ? ? ?MEDICATIONS:  ?Current Outpatient Medications  ?Medication Sig Dispense Refill  ? Ascorbic Acid (VITAMIN C PO) Take by mouth.    ? calcium carbonate (OSCAL) 1500 (600 Ca) MG TABS tablet Take by mouth 2 (two) times daily with a meal.    ? GARLIC OIL PO Take by mouth.    ? Glucosamine HCl (GLUCOSAMINE PO) Take by mouth.    ? Multiple Vitamin (MULTIVITAMIN) tablet Take 1 tablet by mouth daily.    ? Omega-3 1400 MG CAPS Take by mouth.    ? VITAMIN D PO Take by mouth.    ? VITAMIN E PO Take by mouth.    ? ?No current facility-administered medications for this encounter.  ? ? ? ?REVIEW OF SYSTEMS: On review of systems, the patient reports that she is doing well. She's had some soreness in her abdomen since her hysterectomy but is doing well and getting around pretty easily at home. She denies any concerns or pain in her breast. She denies any shortness of breath or chest pain. She feels like her BP has been elevated due to pain from healing from her hysterectomy. No other complaints are verbalized.  ? ?  ? ?PHYSICAL EXAM:  ?Wt Readings from Last 3 Encounters:  ?05/14/21 152 lb 6 oz (69.1 kg)  ?04/30/21 158 lb 14.4 oz (72.1 kg)  ?04/29/21 158 lb 3.2 oz (71.8 kg)  ? ?Temp Readings from Last 3 Encounters:  ?05/14/21 (!) 97.1 ?F (36.2 ?C) (Temporal)  ?05/01/21 98.5 ?F (36.9 ?C)  ?04/29/21 (!) 97.5 ?F (36.4 ?C) (Temporal)  ? ?BP Readings from Last 3 Encounters:  ?05/14/21 (!) 178/82  ?05/01/21 (!) 149/78  ?04/29/21 (!) 150/73  ? ?Pulse Readings from Last 3 Encounters:  ?05/14/21 68  ?05/01/21 73  ?04/29/21 72  ?See repeat BP recorded by nursing ? ?In general this is a well appearing hispanic female in no acute distress. She's alert and oriented x4 and appropriate throughout the examination. Cardiopulmonary assessment is negative for acute distress and she exhibits normal effort.  Her left breast reveals a well-healed surgical incision site without erythema separation or  drainage. ? ? ? ?ECOG = 0 ? ?0 - Asymptomatic (Fully active, able to carry on all predisease activities without restriction) ? ?1 - Symptomatic but completely ambulatory (Restricted in physically strenuous activity but ambulatory and able to carry out work of a light or sedentary nature. For example, light housework, office work) ? ?2 - Symptomatic, <50% in bed during the day (Ambulatory and capable of all self care but unable to carry out any work activities. Up and about more than 50% of waking hours) ? ?3 - Symptomatic, >50% in bed, but not bedbound (Capable of only limited self-care, confined to bed or chair 50% or more of waking hours) ? ?4 - Bedbound (Completely disabled. Cannot carry on any self-care. Totally confined to bed or chair) ? ?5 - Death ? ? Oken MM, Creech RH, Tormey DC, et al. [phone]). "Toxicity and response criteria of the [facility]". Am. Lenna Sciara.  Clin. Oncol. 5 (6): 649-55 ? ? ? ?LABORATORY DATA:  ?Lab Results  ?Component Value Date  ? WBC 20.2 (H) 05/01/2021  ? HGB 12.9 05/01/2021  ? HCT 38.8 05/01/2021  ? MCV 74.2 (L) 05/01/2021  ? PLT 382 05/01/2021  ? ?Lab Results  ?Component Value Date  ? NA 137 03/13/2021  ? K 4.0 03/13/2021  ? CL 106 03/13/2021  ? CO2 23 03/13/2021  ? ?Lab Results  ?Component Value Date  ? ALT 18 03/13/2021  ? AST 22 03/13/2021  ? ALKPHOS 89 03/13/2021  ? BILITOT 0.4 03/13/2021  ? ?  ? ?RADIOGRAPHY: MM Breast Surgical Specimen ? ?Result Date: 04/17/2021 ?CLINICAL DATA:  Status post seed localized lumpectomy of the LEFT breast. EXAM: SPECIMEN RADIOGRAPH OF THE LEFT BREAST COMPARISON:  Previous exam(s). FINDINGS: Status post excision of the left coil shaped breast. The radioactive seed and biopsy marker clip are present and intact. Findings are discussed with the operating room nurse at time of interpretation. IMPRESSION: Specimen radiograph of the left breast. Electronically Signed   By: Nolon Nations M.D.   On: 04/17/2021 09:14 ? ?MM LT RADIOACTIVE SEED  LOC MAMMO GUIDE ? ?Result Date: 04/16/2021 ?CLINICAL DATA:  Patient presents for seed localization of the LEFT breast. Recent diagnosis of ductal carcinoma in situ. EXAM: MAMMOGRAPHIC GUIDED RADIOACTIVE SEED LOCALIZATION O

## 2021-05-13 NOTE — Telephone Encounter (Signed)
Consult reminder call via [place] interpreter service [phone]. Interpreter [phone]. Voicemail left reminding patient of her 9:00am-05/14/21 in-person consult appointment w/ Shona Simpson PA-C. I advised patient to arrive 78mn early for check-in. I left my extension [phone] case patient needs anything. ?

## 2021-05-14 ENCOUNTER — Encounter: Payer: Self-pay | Admitting: Obstetrics & Gynecology

## 2021-05-14 ENCOUNTER — Ambulatory Visit
Admission: RE | Admit: 2021-05-14 | Discharge: 2021-05-14 | Disposition: A | Discharge status: Home / Self Care | Payer: Self-pay | Source: Ambulatory Visit | Attending: Radiation Oncology | Admitting: Radiation Oncology

## 2021-05-14 ENCOUNTER — Ambulatory Visit (INDEPENDENT_AMBULATORY_CARE_PROVIDER_SITE_OTHER): Payer: Self-pay | Admitting: Obstetrics & Gynecology

## 2021-05-14 ENCOUNTER — Other Ambulatory Visit: Payer: Self-pay

## 2021-05-14 ENCOUNTER — Encounter: Payer: Self-pay | Admitting: Radiation Oncology

## 2021-05-14 VITALS — BP 122/84

## 2021-05-14 VITALS — BP 164/97 | HR 67 | Temp 97.1°F | Resp 18 | Ht 59.0 in | Wt 152.4 lb

## 2021-05-14 DIAGNOSIS — Z90722 Acquired absence of ovaries, bilateral: Secondary | ICD-10-CM | POA: Insufficient documentation

## 2021-05-14 DIAGNOSIS — D0512 Intraductal carcinoma in situ of left breast: Secondary | ICD-10-CM

## 2021-05-14 DIAGNOSIS — Z90710 Acquired absence of both cervix and uterus: Secondary | ICD-10-CM | POA: Insufficient documentation

## 2021-05-14 DIAGNOSIS — R946 Abnormal results of thyroid function studies: Secondary | ICD-10-CM | POA: Insufficient documentation

## 2021-05-14 DIAGNOSIS — Z510 Encounter for antineoplastic radiation therapy: Secondary | ICD-10-CM | POA: Insufficient documentation

## 2021-05-14 DIAGNOSIS — Z803 Family history of malignant neoplasm of breast: Secondary | ICD-10-CM | POA: Insufficient documentation

## 2021-05-14 DIAGNOSIS — Z170 Estrogen receptor positive status [ER+]: Secondary | ICD-10-CM | POA: Insufficient documentation

## 2021-05-14 DIAGNOSIS — Z09 Encounter for follow-up examination after completed treatment for conditions other than malignant neoplasm: Secondary | ICD-10-CM

## 2021-05-14 DIAGNOSIS — R030 Elevated blood-pressure reading, without diagnosis of hypertension: Secondary | ICD-10-CM | POA: Insufficient documentation

## 2021-05-14 DIAGNOSIS — E785 Hyperlipidemia, unspecified: Secondary | ICD-10-CM | POA: Insufficient documentation

## 2021-05-14 NOTE — Progress Notes (Signed)
Consult appointment. I verified patient identity and began nursing interview w/ spouse Elvia Collum in attendance. Patient reports no pain w/ LT breast and no issues at this time. ? ?Meaningful use complete. ?Hysterectomy- NO chances of pregnancy. ? ?BP (!) 178/82 (BP Location: Right Arm, Patient Position: Sitting, Cuff Size: Normal)   Pulse 68   Temp (!) 97.1 ?F (36.2 ?C) (Temporal)   Resp 18   Ht '4\' 11"'$  (1.499 m)   Wt 152 lb 6 oz (69.1 kg)   LMP 04/17/2021 (Exact Date)   SpO2 100%   BMI 30.78 kg/m?  ? ?

## 2021-05-14 NOTE — Addendum Note (Signed)
Encounter addended by: Mollie Germany, LPN on: 06/17/7901 83:33 AM ? Actions taken: Vitals modified

## 2021-05-14 NOTE — Progress Notes (Signed)
Patient has blood pressure readings today 05/14/21 of RT arm-178/82- 64 pulse and 2nd reading of RT arm-164/97- 67 pulse. Per Shona Simpson PA-C. Using a [place] interpreter, I have notified patient to contact her doctor. Dr. Dellis Filbert to notify him of these high readings and to immediately go to the ER if she experiences any chest pains, headaches, or shortness of breath. Patient verbalized understanding of the conversation. ? ? ?

## 2021-05-14 NOTE — Progress Notes (Signed)
? ? ?  Helen Mccarthy April 06, 1970 [id number] ? ? ?     51 y.o.  [id number]  ? ?RP: Postop TAH/BSO 2 weeks ? ?HPI: Good postop evolution. Mild lower abdominal discomfort, improving.  Didn't use any pain medication. No vaginal bleeding.  No fever.  Urine/BMs normal. ? ? ?OB History  ?Gravida Para Term Preterm AB Living  ?'4 4 4     4  '$ ?SAB IAB Ectopic Multiple Live Births  ?        4  ?  ?# Outcome Date GA Lbr Len/2nd Weight Sex Delivery Anes PTL Lv  ?4 Term     F CS-Unspec  N LIV  ?3 Term     F CS-Unspec  N LIV  ?2 Term     M CS-Unspec  N LIV  ?1 Term     M CS-Unspec  N LIV  ?   Birth Comments: DECEASED THREE DAYS AFTER BEING BORN-   ? ? ?Past medical history,surgical history, problem list, medications, allergies, family history and social history were all reviewed and documented in the EPIC chart. ? ? ?Directed ROS with pertinent positives and negatives documented in the history of present illness/assessment and plan. ? ?Exam: ? ?Vitals:  ? 05/14/21 1543  ?BP: 122/84  ? ?General appearance:  Normal ? ?Abdomen: Incision well closed.  No erythema, no induration. ? ?Gynecologic exam: Vulva normal.  Speculum:  Vaginal vault well closed.  No discharge, no blood. ? ?FINAL MICROSCOPIC DIAGNOSIS:  ? ?A. UTERUS, CERVIX, BILATERAL TUBES AND OVARIES:  ?- Cervix  ?    Nabothian cyst.  ?    No dysplasia identified.  ?- Endometrium  ?    Secretory.  ?    No hyperplasia or malignancy.  ?- Myometrium  ?    Adenomyosis.  ?    Leiomyomata.  ?    No evidence of malignancy.  ?- Right ovary  ?    Follicle cyst.  ?    No endometriosis or malignancy.  ?- Left ovary  ?    Corpus luteum.  ?    No endometriosis or malignancy.  ?- Bilateral Fallopian tubes  ?    Bilateral tubal ligation.  ?    Bilateral paratubal cysts.  ?    No endometriosis or malignancy.  ? ? ?Assessment/Plan:  51 y.o. [id number]  ? ?1. Status post gynecological surgery, follow-up exam  ?Good postop evolution. Mild lower abdominal discomfort, improving.  Didn't use any  pain medication. No vaginal bleeding.  No fever.  Urine/BMs normal.  Very good healing on exam.  Precautions reviewed.  No sexual activity until reevaluation in 4 weeks. ? ?[name] MD, 3:58 PM 05/14/2021 ? ? ? ?  ?

## 2021-05-14 NOTE — Addendum Note (Signed)
Encounter addended by: Mollie Germany, LPN on: 05/16/6501 54:65 AM ? Actions taken: Clinical Note Signed

## 2021-05-21 ENCOUNTER — Encounter: Payer: Self-pay | Admitting: *Deleted

## 2021-05-23 ENCOUNTER — Other Ambulatory Visit: Payer: Self-pay

## 2021-05-23 ENCOUNTER — Ambulatory Visit
Admission: RE | Admit: 2021-05-23 | Discharge: 2021-05-23 | Disposition: A | Discharge status: Home / Self Care | Payer: Self-pay | Source: Ambulatory Visit | Attending: Radiation Oncology | Admitting: Radiation Oncology

## 2021-05-23 LAB — RAD ONC ARIA SESSION SUMMARY
Course Elapsed Days: 0
Plan Fractions Treated to Date: 1
Plan Prescribed Dose Per Fraction: 2.66 Gy
Plan Total Fractions Prescribed: 16
Plan Total Prescribed Dose: 42.56 Gy
Reference Point Dosage Given to Date: 2.66 Gy
Reference Point Session Dosage Given: 2.66 Gy
Session Number: 1

## 2021-05-23 NOTE — Progress Notes (Signed)
Pt here for patient teaching.  Pt given Radiation and You booklet, skin care instructions, alra deodorant and Radiaplex.    Reviewed areas of pertinence such as fatigue, hair loss, skin changes, breast tenderness, and breast swelling. Pt able to give teach back of to pat skin and use unscented/gentle soap,apply Radiaplex bid, avoid applying anything to skin within 4 hours of treatment, avoid wearing an under wire bra, and to use an electric razor if they must shave. Pt verbalizes understanding of information given and will contact nursing with any questions or concerns.   ?

## 2021-05-24 ENCOUNTER — Other Ambulatory Visit: Payer: Self-pay

## 2021-05-24 ENCOUNTER — Ambulatory Visit
Admission: RE | Admit: 2021-05-24 | Discharge: 2021-05-24 | Disposition: A | Discharge status: Home / Self Care | Payer: Self-pay | Source: Ambulatory Visit | Attending: Radiation Oncology | Admitting: Radiation Oncology

## 2021-05-24 DIAGNOSIS — D0512 Intraductal carcinoma in situ of left breast: Secondary | ICD-10-CM

## 2021-05-24 LAB — RAD ONC ARIA SESSION SUMMARY
Course Elapsed Days: 1
Plan Fractions Treated to Date: 2
Plan Prescribed Dose Per Fraction: 2.66 Gy
Plan Total Fractions Prescribed: 16
Plan Total Prescribed Dose: 42.56 Gy
Reference Point Dosage Given to Date: 5.32 Gy
Reference Point Session Dosage Given: 2.66 Gy
Session Number: 2

## 2021-05-24 MED ORDER — ALRA NON-METALLIC DEODORANT (RAD-ONC)
1.0000 "application " | Freq: Once | TOPICAL | Status: AC
  Administered 2021-05-24: 1 via TOPICAL

## 2021-05-24 MED ORDER — RADIAPLEXRX EX GEL: Freq: Once | CUTANEOUS | Status: AC

## 2021-05-27 ENCOUNTER — Ambulatory Visit
Admission: RE | Admit: 2021-05-27 | Discharge: 2021-05-27 | Disposition: A | Discharge status: Home / Self Care | Payer: Self-pay | Source: Ambulatory Visit | Attending: Radiation Oncology | Admitting: Radiation Oncology

## 2021-05-27 ENCOUNTER — Other Ambulatory Visit: Payer: Self-pay

## 2021-05-27 LAB — RAD ONC ARIA SESSION SUMMARY
Course Elapsed Days: 4
Plan Fractions Treated to Date: 3
Plan Prescribed Dose Per Fraction: 2.66 Gy
Plan Total Fractions Prescribed: 16
Plan Total Prescribed Dose: 42.56 Gy
Reference Point Dosage Given to Date: 7.98 Gy
Reference Point Session Dosage Given: 2.66 Gy
Session Number: 3

## 2021-05-28 ENCOUNTER — Other Ambulatory Visit: Payer: Self-pay

## 2021-05-28 ENCOUNTER — Ambulatory Visit
Admission: RE | Admit: 2021-05-28 | Discharge: 2021-05-28 | Disposition: A | Discharge status: Home / Self Care | Payer: Self-pay | Source: Ambulatory Visit | Attending: Radiation Oncology | Admitting: Radiation Oncology

## 2021-05-28 LAB — RAD ONC ARIA SESSION SUMMARY
Course Elapsed Days: 5
Plan Fractions Treated to Date: 4
Plan Prescribed Dose Per Fraction: 2.66 Gy
Plan Total Fractions Prescribed: 16
Plan Total Prescribed Dose: 42.56 Gy
Reference Point Dosage Given to Date: 10.64 Gy
Reference Point Session Dosage Given: 2.66 Gy
Session Number: 4

## 2021-05-29 ENCOUNTER — Other Ambulatory Visit: Payer: Self-pay

## 2021-05-29 ENCOUNTER — Ambulatory Visit
Admission: RE | Admit: 2021-05-29 | Discharge: 2021-05-29 | Disposition: A | Discharge status: Home / Self Care | Payer: Self-pay | Source: Ambulatory Visit | Attending: Radiation Oncology | Admitting: Radiation Oncology

## 2021-05-29 LAB — RAD ONC ARIA SESSION SUMMARY
Course Elapsed Days: 6
Plan Fractions Treated to Date: 5
Plan Prescribed Dose Per Fraction: 2.66 Gy
Plan Total Fractions Prescribed: 16
Plan Total Prescribed Dose: 42.56 Gy
Reference Point Dosage Given to Date: 13.3 Gy
Reference Point Session Dosage Given: 2.66 Gy
Session Number: 5

## 2021-05-30 ENCOUNTER — Other Ambulatory Visit: Payer: Self-pay

## 2021-05-30 ENCOUNTER — Ambulatory Visit
Admission: RE | Admit: 2021-05-30 | Discharge: 2021-05-30 | Disposition: A | Discharge status: Home / Self Care | Payer: Self-pay | Source: Ambulatory Visit | Attending: Radiation Oncology | Admitting: Radiation Oncology

## 2021-05-30 LAB — RAD ONC ARIA SESSION SUMMARY
Course Elapsed Days: 7
Plan Fractions Treated to Date: 6
Plan Prescribed Dose Per Fraction: 2.66 Gy
Plan Total Fractions Prescribed: 16
Plan Total Prescribed Dose: 42.56 Gy
Reference Point Dosage Given to Date: 15.96 Gy
Reference Point Session Dosage Given: 2.66 Gy
Session Number: 6

## 2021-05-31 ENCOUNTER — Ambulatory Visit
Admission: RE | Admit: 2021-05-31 | Discharge: 2021-05-31 | Disposition: A | Discharge status: Home / Self Care | Payer: Self-pay | Source: Ambulatory Visit | Attending: Radiation Oncology | Admitting: Radiation Oncology

## 2021-05-31 ENCOUNTER — Other Ambulatory Visit: Payer: Self-pay

## 2021-05-31 LAB — RAD ONC ARIA SESSION SUMMARY
Course Elapsed Days: 8
Plan Fractions Treated to Date: 7
Plan Prescribed Dose Per Fraction: 2.66 Gy
Plan Total Fractions Prescribed: 16
Plan Total Prescribed Dose: 42.56 Gy
Reference Point Dosage Given to Date: 18.62 Gy
Reference Point Session Dosage Given: 2.66 Gy
Session Number: 7

## 2021-06-03 ENCOUNTER — Other Ambulatory Visit: Payer: Self-pay

## 2021-06-03 ENCOUNTER — Ambulatory Visit
Admission: RE | Admit: 2021-06-03 | Discharge: 2021-06-03 | Disposition: A | Discharge status: Home / Self Care | Payer: Self-pay | Source: Ambulatory Visit | Attending: Radiation Oncology | Admitting: Radiation Oncology

## 2021-06-03 LAB — RAD ONC ARIA SESSION SUMMARY
Course Elapsed Days: 11
Plan Fractions Treated to Date: 8
Plan Prescribed Dose Per Fraction: 2.66 Gy
Plan Total Fractions Prescribed: 16
Plan Total Prescribed Dose: 42.56 Gy
Reference Point Dosage Given to Date: 21.28 Gy
Reference Point Session Dosage Given: 2.66 Gy
Session Number: 8

## 2021-06-04 ENCOUNTER — Ambulatory Visit
Admission: RE | Admit: 2021-06-04 | Discharge: 2021-06-04 | Disposition: A | Discharge status: Home / Self Care | Payer: Self-pay | Source: Ambulatory Visit | Attending: Radiation Oncology | Admitting: Radiation Oncology

## 2021-06-04 ENCOUNTER — Other Ambulatory Visit: Payer: Self-pay

## 2021-06-04 LAB — RAD ONC ARIA SESSION SUMMARY
Course Elapsed Days: 12
Plan Fractions Treated to Date: 9
Plan Prescribed Dose Per Fraction: 2.66 Gy
Plan Total Fractions Prescribed: 16
Plan Total Prescribed Dose: 42.56 Gy
Reference Point Dosage Given to Date: 23.94 Gy
Reference Point Session Dosage Given: 2.66 Gy
Session Number: 9

## 2021-06-05 ENCOUNTER — Other Ambulatory Visit: Payer: Self-pay

## 2021-06-05 ENCOUNTER — Ambulatory Visit
Admission: RE | Admit: 2021-06-05 | Discharge: 2021-06-05 | Disposition: A | Discharge status: Home / Self Care | Payer: Self-pay | Source: Ambulatory Visit | Attending: Radiation Oncology | Admitting: Radiation Oncology

## 2021-06-05 LAB — RAD ONC ARIA SESSION SUMMARY
Course Elapsed Days: 13
Plan Fractions Treated to Date: 10
Plan Prescribed Dose Per Fraction: 2.66 Gy
Plan Total Fractions Prescribed: 16
Plan Total Prescribed Dose: 42.56 Gy
Reference Point Dosage Given to Date: 26.6 Gy
Reference Point Session Dosage Given: 2.66 Gy
Session Number: 10

## 2021-06-06 ENCOUNTER — Ambulatory Visit
Admission: RE | Admit: 2021-06-06 | Discharge: 2021-06-06 | Disposition: A | Discharge status: Home / Self Care | Payer: Self-pay | Source: Ambulatory Visit | Attending: Radiation Oncology | Admitting: Radiation Oncology

## 2021-06-06 ENCOUNTER — Other Ambulatory Visit: Payer: Self-pay

## 2021-06-06 LAB — RAD ONC ARIA SESSION SUMMARY
Course Elapsed Days: 14
Plan Fractions Treated to Date: 11
Plan Prescribed Dose Per Fraction: 2.66 Gy
Plan Total Fractions Prescribed: 16
Plan Total Prescribed Dose: 42.56 Gy
Reference Point Dosage Given to Date: 29.26 Gy
Reference Point Session Dosage Given: 2.66 Gy
Session Number: 11

## 2021-06-07 ENCOUNTER — Other Ambulatory Visit: Payer: Self-pay

## 2021-06-07 ENCOUNTER — Ambulatory Visit
Admission: RE | Admit: 2021-06-07 | Discharge: 2021-06-07 | Disposition: A | Discharge status: Home / Self Care | Payer: Self-pay | Source: Ambulatory Visit | Attending: Radiation Oncology | Admitting: Radiation Oncology

## 2021-06-07 LAB — RAD ONC ARIA SESSION SUMMARY
Course Elapsed Days: 15
Plan Fractions Treated to Date: 12
Plan Prescribed Dose Per Fraction: 2.66 Gy
Plan Total Fractions Prescribed: 16
Plan Total Prescribed Dose: 42.56 Gy
Reference Point Dosage Given to Date: 31.92 Gy
Reference Point Session Dosage Given: 2.66 Gy
Session Number: 12

## 2021-06-11 ENCOUNTER — Ambulatory Visit
Admission: RE | Admit: 2021-06-11 | Discharge: 2021-06-11 | Disposition: A | Discharge status: Home / Self Care | Payer: Self-pay | Source: Ambulatory Visit | Attending: Radiation Oncology | Admitting: Radiation Oncology

## 2021-06-11 ENCOUNTER — Other Ambulatory Visit: Payer: Self-pay

## 2021-06-11 LAB — RAD ONC ARIA SESSION SUMMARY
Course Elapsed Days: 19
Plan Fractions Treated to Date: 13
Plan Prescribed Dose Per Fraction: 2.66 Gy
Plan Total Fractions Prescribed: 16
Plan Total Prescribed Dose: 42.56 Gy
Reference Point Dosage Given to Date: 34.58 Gy
Reference Point Session Dosage Given: 2.66 Gy
Session Number: 13

## 2021-06-12 ENCOUNTER — Other Ambulatory Visit: Payer: Self-pay

## 2021-06-12 ENCOUNTER — Ambulatory Visit
Admission: RE | Admit: 2021-06-12 | Discharge: 2021-06-12 | Disposition: A | Discharge status: Home / Self Care | Payer: Self-pay | Source: Ambulatory Visit | Attending: Radiation Oncology | Admitting: Radiation Oncology

## 2021-06-12 LAB — RAD ONC ARIA SESSION SUMMARY
Course Elapsed Days: 20
Plan Fractions Treated to Date: 14
Plan Prescribed Dose Per Fraction: 2.66 Gy
Plan Total Fractions Prescribed: 16
Plan Total Prescribed Dose: 42.56 Gy
Reference Point Dosage Given to Date: 37.24 Gy
Reference Point Session Dosage Given: 2.66 Gy
Session Number: 14

## 2021-06-12 NOTE — Progress Notes (Signed)
HEMATOLOGY-ONCOLOGY TELEPHONE VISIT PROGRESS NOTE  I connected with Helen Mccarthy on 06/25/21 at 10:15 AM EDT by telephone and verified that I am speaking with the correct person using two identifiers.  I discussed the limitations, risks, security and privacy concerns of performing an evaluation and management service by telephone and the availability of in person appointments.  I also discussed with the patient that there may be a patient responsible charge related to this service. The patient expressed understanding and agreed to proceed.   History of Present Illness:  Helen Mccarthy is a  51 y.o. female is here because of recent diagnosis of DCIS of the left breast. She presents to the clinic today via telephone follow-up following completion of XRT  Oncology History  Ductal carcinoma in situ (DCIS) of left breast  03/06/2021 Initial Diagnosis   Left breast calcifications and diagnostic mammogram: 1.1 cm, additional mass at 2:00: 1.7 cm (fibroadenoma).  The calcifications and biopsy came back intermediate grade DCIS ER 95% PR 100%, 1 axillary lymph node was biopsied and was benign concordant   03/13/2021 Cancer Staging   Staging form: Breast, AJCC 8th Edition - Clinical: Stage 0 (cTis (DCIS), cN0, cM0, G2, ER+, PR+, HER2: Not Assessed) - Signed by Nicholas Lose, MD on 03/13/2021 Stage prefix: Initial diagnosis Histologic grading system: 3 grade system   03/21/2021 Genetic Testing   Negative hereditary cancer genetic testing: no pathogenic variants detected by [facility] BRCAPlus Panel or CustomNext-Cancer +RNAinsight.  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  The report dates are March 21, 2021 and March 27, 2021.   The BRCAplus panel offered by [employer] and includes sequencing and deletion/duplication analysis for the following 8 genes: ATM, BRCA1, BRCA2, CDH1, CHEK2, PALB2, PTEN, and TP53.  The CustomNext-Cancer+RNAinsight panel offered by [employer]  includes sequencing and rearrangement analysis for the following 47 genes:  APC, ATM, AXIN2, BARD1, BMPR1A, BRCA1, BRCA2, BRIP1, CDH1, CDK4, CDKN2A, CHEK2, DICER1, EPCAM, GREM1, HOXB13, MEN1, MLH1, MSH2, MSH3, MSH6, MUTYH, NBN, NF1, NF2, NTHL1, PALB2, PMS2, POLD1, POLE, PTEN, RAD51C, RAD51D, RECQL, RET, SDHA, SDHAF2, SDHB, SDHC, SDHD, SMAD4, SMARCA4, STK11, TP53, TSC1, TSC2, and VHL.  RNA data is routinely analyzed for use in variant interpretation for all genes.   04/17/2021 Surgery   Left lumpectomy: Residual DCIS not identified.  Focal ALH, fibrocystic changes   05/24/2021 - 06/20/2021 Radiation Therapy   Adj XRT     REVIEW OF SYSTEMS:   Constitutional: Denies fevers, chills or abnormal weight loss All other systems were reviewed with the patient and are negative. Observations/Objective:     Assessment Plan:  Ductal carcinoma in situ (DCIS) of left breast 03/06/2021:Left breast calcifications and diagnostic mammogram: 1.1 cm, additional mass at 2:00: 1.7 cm (fibroadenoma).  The calcifications and biopsy came back intermediate grade DCIS ER 95% PR 100%, 1 axillary lymph node was biopsied and was benign concordant 04/17/2021:Left lumpectomy: Residual DCIS not identified.  Focal ALH, fibrocystic changes .  Treatment plan: 1. Adjuvant radiation therapy completed 06/20/21 2. Followed by antiestrogen therapy with tamoxifen 5 years started 06/25/21 at 10 mg  We discussed the risks and benefits of tamoxifen. These include but not limited to insomnia, hot flashes, mood changes, vaginal dryness, and weight gain. Although rare, serious side effects including risk of blood clots were also discussed. We strongly believe that the benefits far outweigh the risks. Patient understands these risks and consented to starting treatment. Planned treatment duration is 5 years.  Return to clinic in 3 months  for SCP visit    I discussed the assessment and treatment plan with the patient. The patient was provided an  opportunity to ask questions and all were answered. The patient agreed with the plan and demonstrated an understanding of the instructions. The patient was advised to call back or seek an in-person evaluation if the symptoms worsen or if the condition fails to improve as anticipated.   I provided 12 minutes of non-face-to-face time during this encounter. Harriette Ohara, MD

## 2021-06-13 ENCOUNTER — Ambulatory Visit
Admission: RE | Admit: 2021-06-13 | Discharge: 2021-06-13 | Disposition: A | Discharge status: Home / Self Care | Payer: Self-pay | Source: Ambulatory Visit | Attending: Radiation Oncology | Admitting: Radiation Oncology

## 2021-06-13 ENCOUNTER — Other Ambulatory Visit: Payer: Self-pay

## 2021-06-13 DIAGNOSIS — D0512 Intraductal carcinoma in situ of left breast: Secondary | ICD-10-CM | POA: Insufficient documentation

## 2021-06-13 DIAGNOSIS — Z510 Encounter for antineoplastic radiation therapy: Secondary | ICD-10-CM | POA: Insufficient documentation

## 2021-06-13 LAB — RAD ONC ARIA SESSION SUMMARY
Course Elapsed Days: 21
Plan Fractions Treated to Date: 15
Plan Prescribed Dose Per Fraction: 2.66 Gy
Plan Total Fractions Prescribed: 16
Plan Total Prescribed Dose: 42.56 Gy
Reference Point Dosage Given to Date: 39.9 Gy
Reference Point Session Dosage Given: 2.66 Gy
Session Number: 15

## 2021-06-14 ENCOUNTER — Ambulatory Visit: Payer: Self-pay | Admitting: Radiation Oncology

## 2021-06-14 ENCOUNTER — Ambulatory Visit
Admission: RE | Admit: 2021-06-14 | Discharge: 2021-06-14 | Disposition: A | Discharge status: Home / Self Care | Payer: Self-pay | Source: Ambulatory Visit | Attending: Radiation Oncology | Admitting: Radiation Oncology

## 2021-06-14 ENCOUNTER — Other Ambulatory Visit: Payer: Self-pay

## 2021-06-14 LAB — RAD ONC ARIA SESSION SUMMARY
Course Elapsed Days: 22
Plan Fractions Treated to Date: 16
Plan Prescribed Dose Per Fraction: 2.66 Gy
Plan Total Fractions Prescribed: 16
Plan Total Prescribed Dose: 42.56 Gy
Reference Point Dosage Given to Date: 42.56 Gy
Reference Point Session Dosage Given: 2.66 Gy
Session Number: 16

## 2021-06-17 ENCOUNTER — Other Ambulatory Visit: Payer: Self-pay

## 2021-06-17 ENCOUNTER — Ambulatory Visit
Admission: RE | Admit: 2021-06-17 | Discharge: 2021-06-17 | Disposition: A | Discharge status: Home / Self Care | Payer: Self-pay | Source: Ambulatory Visit | Attending: Radiation Oncology | Admitting: Radiation Oncology

## 2021-06-17 LAB — RAD ONC ARIA SESSION SUMMARY
Course Elapsed Days: 25
Plan Fractions Treated to Date: 1
Plan Prescribed Dose Per Fraction: 2 Gy
Plan Total Fractions Prescribed: 4
Plan Total Prescribed Dose: 8 Gy
Reference Point Dosage Given to Date: 44.56 Gy
Reference Point Session Dosage Given: 2 Gy
Session Number: 17

## 2021-06-18 ENCOUNTER — Ambulatory Visit
Admission: RE | Admit: 2021-06-18 | Discharge: 2021-06-18 | Disposition: A | Discharge status: Home / Self Care | Payer: Self-pay | Source: Ambulatory Visit | Attending: Radiation Oncology | Admitting: Radiation Oncology

## 2021-06-18 ENCOUNTER — Other Ambulatory Visit: Payer: Self-pay

## 2021-06-18 LAB — RAD ONC ARIA SESSION SUMMARY
Course Elapsed Days: 26
Plan Fractions Treated to Date: 2
Plan Prescribed Dose Per Fraction: 2 Gy
Plan Total Fractions Prescribed: 4
Plan Total Prescribed Dose: 8 Gy
Reference Point Dosage Given to Date: 46.56 Gy
Reference Point Session Dosage Given: 2 Gy
Session Number: 18

## 2021-06-19 ENCOUNTER — Ambulatory Visit
Admission: RE | Admit: 2021-06-19 | Discharge: 2021-06-19 | Disposition: A | Discharge status: Home / Self Care | Payer: Self-pay | Source: Ambulatory Visit | Attending: Radiation Oncology | Admitting: Radiation Oncology

## 2021-06-19 ENCOUNTER — Other Ambulatory Visit: Payer: Self-pay

## 2021-06-19 ENCOUNTER — Encounter: Payer: Self-pay | Admitting: *Deleted

## 2021-06-19 DIAGNOSIS — D0512 Intraductal carcinoma in situ of left breast: Secondary | ICD-10-CM

## 2021-06-19 LAB — RAD ONC ARIA SESSION SUMMARY
Course Elapsed Days: 27
Plan Fractions Treated to Date: 3
Plan Prescribed Dose Per Fraction: 2 Gy
Plan Total Fractions Prescribed: 4
Plan Total Prescribed Dose: 8 Gy
Reference Point Dosage Given to Date: 48.56 Gy
Reference Point Session Dosage Given: 2 Gy
Session Number: 19

## 2021-06-20 ENCOUNTER — Ambulatory Visit
Admission: RE | Admit: 2021-06-20 | Discharge: 2021-06-20 | Disposition: A | Discharge status: Home / Self Care | Payer: Self-pay | Source: Ambulatory Visit | Attending: Radiation Oncology | Admitting: Radiation Oncology

## 2021-06-20 ENCOUNTER — Other Ambulatory Visit: Payer: Self-pay

## 2021-06-20 ENCOUNTER — Encounter: Payer: Self-pay | Admitting: Radiation Oncology

## 2021-06-20 LAB — RAD ONC ARIA SESSION SUMMARY
Course Elapsed Days: 28
Plan Fractions Treated to Date: 4
Plan Prescribed Dose Per Fraction: 2 Gy
Plan Total Fractions Prescribed: 4
Plan Total Prescribed Dose: 8 Gy
Reference Point Dosage Given to Date: 50.56 Gy
Reference Point Session Dosage Given: 2 Gy
Session Number: 20

## 2021-06-24 NOTE — Progress Notes (Signed)
                                                                                                                                                             Patient Name: Helen Mccarthy MRN: [id number] DOB: 22-Mar-1970 Referring Physician: Nicholas Lose (Profile Not Attached) Date of Service: 06/20/2021 [facility], [place]                                                        End Of Treatment Note  Diagnoses: D05.12-Intraductal carcinoma in situ of left breast  Cancer Staging: Intermediate grade ER/PR positive DCIS of the left breast  Intent: Curative  Radiation Treatment Dates: 05/23/2021 through 06/20/2021 Site Technique Total Dose (Gy) Dose per Fx (Gy) Completed Fx Beam Energies  Breast, Left: Breast_L 3D 42.56/42.56 2.66 16/16 6X  Breast, Left: Breast_L_Bst 3D 8/8 2 4/4 6X   Narrative: The patient tolerated radiation therapy relatively well. She developed fatigue and anticipated skin changes in the treatment field.   Plan: The patient will receive a call in about one month from the radiation oncology department. She will continue follow up with Dr. Lindi Adie as well.   ________________________________________________    Carola Rhine, [facility]

## 2021-06-25 ENCOUNTER — Ambulatory Visit (INDEPENDENT_AMBULATORY_CARE_PROVIDER_SITE_OTHER): Payer: Self-pay | Admitting: Obstetrics & Gynecology

## 2021-06-25 ENCOUNTER — Encounter: Payer: Self-pay | Admitting: Obstetrics & Gynecology

## 2021-06-25 ENCOUNTER — Inpatient Hospital Stay: Payer: Self-pay | Attending: Hematology and Oncology | Admitting: Hematology and Oncology

## 2021-06-25 VITALS — BP 120/78

## 2021-06-25 DIAGNOSIS — D0512 Intraductal carcinoma in situ of left breast: Secondary | ICD-10-CM

## 2021-06-25 DIAGNOSIS — Z09 Encounter for follow-up examination after completed treatment for conditions other than malignant neoplasm: Secondary | ICD-10-CM

## 2021-06-25 MED ORDER — TAMOXIFEN CITRATE 10 MG PO TABS: 10.0000 mg | ORAL_TABLET | Freq: Two times a day (BID) | ORAL | 4 refills | Status: DC

## 2021-06-25 NOTE — Assessment & Plan Note (Signed)
03/06/2021:Left breast calcifications and diagnostic mammogram: 1.1 cm, additional mass at 2:00: 1.7 cm (fibroadenoma).  The calcifications and biopsy came back intermediate grade DCIS ER 95% PR 100%, 1 axillary lymph node was biopsied and was benign concordant 04/17/2021:Left lumpectomy: Residual DCIS not identified.  Focal ALH, fibrocystic changes .  Treatment plan: 1. adjuvant radiation therapy 2. Followed by antiestrogen therapy with tamoxifen 5 years  Return to clinic after radiation is complete

## 2021-06-25 NOTE — Progress Notes (Signed)
    Helen Mccarthy January 25, 1970 [id number]        51 y.o.  [id number]   RP: Postop TAH/BSO 04/30/21  HPI: Postop TAH/BSO 04/30/21.  Excellent postop evolution.  No abdominopelvic pain.  No vaginal bleeding.  No vaginal discharge.  No fever.   OB History  Gravida Para Term Preterm AB Living  '4 4 4     4  '$ SAB IAB Ectopic Multiple Live Births          4    # Outcome Date GA Lbr Len/2nd Weight Sex Delivery Anes PTL Lv  4 Term     F CS-Unspec  N LIV  3 Term     F CS-Unspec  N LIV  2 Term     M CS-Unspec  N LIV  1 Term     M CS-Unspec  N LIV     Birth Comments: DECEASED THREE DAYS AFTER BEING BORN-     Past medical history,surgical history, problem list, medications, allergies, family history and social history were all reviewed and documented in the EPIC chart.   Directed ROS with pertinent positives and negatives documented in the history of present illness/assessment and plan.  Exam:  Vitals:   06/25/21 1525  BP: 120/78   General appearance:  Normal  Abdomen: Incision very well healed.  Abdomen soft, NT.  Gynecologic exam: Vulva normal.  Bimanual exam:  Vaginal vault well healed.  No pelvic mass or cyst felt, NT.   Assessment/Plan:  51 y.o. G4P4L4   1. Status post gynecological surgery, follow-up exam  Postop TAH/BSO 04/30/21.  Excellent postop evolution.  No abdominopelvic pain.  No vaginal bleeding.  No vaginal discharge.  No fever.  Good exam, incision and vaginal vault well healed.  Can resume all activities, physical and sexual.  No restriction.  Released from [place].    [name] MD, 3:42 PM 06/25/2021

## 2021-07-31 NOTE — Progress Notes (Signed)
  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 She developed fatigue and anticipated skin changes in the treatment field. Her skin has improved, but she is still having stinging sensations deep in the breast. She denies any fevers, or opening of her incision site. She denies swelling of her breast, redness or warmth.    Impression/Plan: 1. Intermediate grade ER/PR positive DCIS of the left breast. The patient has been doing well since completion of radiotherapy. We discussed that we would be happy to continue to follow her as needed, but she will also continue to follow up with Dr. Lindi Adie in medical oncology. She was counseled on skin care as well as measures to avoid sun exposure to this area.  2. Left breast pain. I encouraged her to reach out to Dr. Ninfa Linden to evaluate her symptoms that sound surgical in nature.      Carola Rhine, PAC

## 2021-08-05 ENCOUNTER — Ambulatory Visit
Admission: RE | Admit: 2021-08-05 | Discharge: 2021-08-05 | Disposition: A | Discharge status: Home / Self Care | Payer: Self-pay | Source: Ambulatory Visit | Attending: Radiation Oncology | Admitting: Radiation Oncology

## 2021-08-05 DIAGNOSIS — D0512 Intraductal carcinoma in situ of left breast: Secondary | ICD-10-CM | POA: Insufficient documentation

## 2021-09-26 ENCOUNTER — Inpatient Hospital Stay: Payer: Self-pay | Attending: Hematology and Oncology | Admitting: Adult Health

## 2021-09-26 ENCOUNTER — Encounter: Payer: Self-pay | Admitting: Adult Health

## 2021-09-26 ENCOUNTER — Other Ambulatory Visit: Payer: Self-pay

## 2021-09-26 VITALS — BP 150/67 | HR 61 | Temp 97.7°F | Resp 16 | Ht 59.0 in | Wt 149.4 lb

## 2021-09-26 DIAGNOSIS — D0512 Intraductal carcinoma in situ of left breast: Secondary | ICD-10-CM | POA: Insufficient documentation

## 2021-09-26 DIAGNOSIS — Z79810 Long term (current) use of selective estrogen receptor modulators (SERMs): Secondary | ICD-10-CM | POA: Insufficient documentation

## 2021-09-26 DIAGNOSIS — Z170 Estrogen receptor positive status [ER+]: Secondary | ICD-10-CM | POA: Insufficient documentation

## 2021-09-26 DIAGNOSIS — Z923 Personal history of irradiation: Secondary | ICD-10-CM | POA: Insufficient documentation

## 2021-09-26 NOTE — Progress Notes (Signed)
SURVIVORSHIP VISIT:  BRIEF ONCOLOGIC HISTORY:  Oncology History  Ductal carcinoma in situ (DCIS) of left breast  03/06/2021 Initial Diagnosis   Left breast calcifications and diagnostic mammogram: 1.1 cm, additional mass at 2:00: 1.7 cm (fibroadenoma).  The calcifications and biopsy came back intermediate grade DCIS ER 95% PR 100%, 1 axillary lymph node was biopsied and was benign concordant   03/13/2021 Cancer Staging   Staging form: Breast, AJCC 8th Edition - Clinical: Stage 0 (cTis (DCIS), cN0, cM0, G2, ER+, PR+, HER2: Not Assessed) - Signed by Nicholas Lose, MD on 03/13/2021 Stage prefix: Initial diagnosis Histologic grading system: 3 grade system   03/21/2021 Genetic Testing   Negative hereditary cancer genetic testing: no pathogenic variants detected by [employer] BRCAPlus Panel or CustomNext-Cancer +RNAinsight.  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  The report dates are March 21, 2021 and March 27, 2021.   The BRCAplus panel offered by [employer] and includes sequencing and deletion/duplication analysis for the following 8 genes: ATM, BRCA1, BRCA2, CDH1, CHEK2, PALB2, PTEN, and TP53.  The CustomNext-Cancer+RNAinsight panel offered by [employer] includes sequencing and rearrangement analysis for the following 47 genes:  APC, ATM, AXIN2, BARD1, BMPR1A, BRCA1, BRCA2, BRIP1, CDH1, CDK4, CDKN2A, CHEK2, DICER1, EPCAM, GREM1, HOXB13, MEN1, MLH1, MSH2, MSH3, MSH6, MUTYH, NBN, NF1, NF2, NTHL1, PALB2, PMS2, POLD1, POLE, PTEN, RAD51C, RAD51D, RECQL, RET, SDHA, SDHAF2, SDHB, SDHC, SDHD, SMAD4, SMARCA4, STK11, TP53, TSC1, TSC2, and VHL.  RNA data is routinely analyzed for use in variant interpretation for all genes.   04/17/2021 Surgery   Left lumpectomy: Residual DCIS not identified.  Focal ALH, fibrocystic changes   05/24/2021 - 06/20/2021 Radiation Therapy   Site Technique Total Dose (Gy) Dose per Fx (Gy) Completed Fx Beam Energies  Breast, Left: Breast_L 3D  42.56/42.56 2.66 16/16 6X  Breast, Left: Breast_L_Bst 3D 8/8 2 4/4 6X     06/25/2021 -  Anti-estrogen oral therapy   10 mg Tamoxifen x 5 years     INTERVAL HISTORY:  [name] to review her survivorship care plan detailing her treatment course for breast cancer, as well as monitoring long-term side effects of that treatment, education regarding health maintenance, screening, and overall wellness and health promotion.   She is accompanied by her husband and Almyra Free our [place] interpreter.  Overall, [name] reports feeling quite well.  She is taking Tamoxifen daily.  She is experiencing some "poking" at her left breast that is intermittent.  This has been going on for about two months.  She denies erythema or swelling.  She experiences hot flashes at night.  She has decreased interest in sexual intimacy.    REVIEW OF SYSTEMS:  Review of Systems  Constitutional:  Negative for appetite change, chills, fatigue, fever and unexpected weight change.  HENT:   Negative for hearing loss, lump/mass and trouble swallowing.   Eyes:  Negative for eye problems and icterus.  Respiratory:  Negative for chest tightness, cough and shortness of breath.   Cardiovascular:  Negative for chest pain, leg swelling and palpitations.  Gastrointestinal:  Negative for abdominal distention, abdominal pain, constipation, diarrhea, nausea and vomiting.  Endocrine: Negative for hot flashes.  Genitourinary:  Negative for difficulty urinating.   Musculoskeletal:  Negative for arthralgias.  Skin:  Negative for itching and rash.  Neurological:  Negative for dizziness, extremity weakness, headaches and numbness.  Hematological:  Negative for adenopathy. Does not bruise/bleed easily.  Psychiatric/Behavioral:  Negative for depression. The patient is not nervous/anxious.  Breast: Denies any new nodularity, masses, tenderness, nipple changes, or nipple discharge.      ONCOLOGY TREATMENT TEAM:  1.  Surgeon:  Dr. Ninfa Linden at [facility] 2. Medical Oncologist: Dr. Lindi Adie  3. Radiation Oncologist: Dr. Lisbeth Renshaw    PAST MEDICAL/SURGICAL HISTORY:  Past Medical History:  Diagnosis Date   Anemia    heavy menstrual bleeding and fibroids   Breast cancer ([place]) 02/2021   ductal carcinoma in situ left breast   Elevated TSH    Endometrial mass    Family history of breast cancer 03/13/2021   History of PID    Hyperlipidemia    Menorrhagia    Uterine fibroid    Past Surgical History:  Procedure Laterality Date   BREAST BIOPSY Left 03/01/2021   BREAST BIOPSY Left 03/06/2021   BREAST LUMPECTOMY WITH RADIOACTIVE SEED LOCALIZATION Left 04/17/2021   Procedure: LEFT BREAST LUMPECTOMY WITH RADIOACTIVE SEED LOCALIZATION;  Surgeon: Coralie Keens, MD;  Location: [place];  Service: General;  Laterality: Left;   CESAREAN SECTION     X4   DILATATION & CURETTAGE/HYSTEROSCOPY WITH MYOSURE N/A 08/28/2017   Procedure: [place];  Surgeon: [name], MD;  Location: [place];  Service: Gynecology;  Laterality: N/A;  requests 7:30am OR time in [place] [facility] block requests XL Myosure  requests one hour OR time   HYSTERECTOMY ABDOMINAL WITH SALPINGECTOMY Bilateral 04/30/2021   Procedure: TOTAL ABDOMINAL HYSTERECTOMY WITH BILATERAL SALPINGECTOMY and oophorectomy;  Surgeon: [name], MD;  Location: [place];  Service: Gynecology;  Laterality: Bilateral;   KNEE SURGERY Right    SHOULDER SURGERY Right    TUBAL LIGATION       ALLERGIES:  No Known Allergies   CURRENT MEDICATIONS:  Outpatient Encounter Medications as of 09/26/2021  Medication Sig   Ascorbic Acid (VITAMIN C PO) Take by mouth.   calcium carbonate (OSCAL) 1500 (600 Ca) MG TABS tablet Take by mouth 2 (two) times daily with a meal.   ferrous sulfate 325 (65 FE) MG tablet Take by mouth.   GARLIC OIL PO Take by mouth.    Glucosamine HCl (GLUCOSAMINE PO) Take by mouth.   Multiple Vitamin (MULTIVITAMIN) tablet Take 1 tablet by mouth daily.   Omega-3 1400 MG CAPS Take by mouth.   tamoxifen (NOLVADEX) 10 MG tablet Take 1 tablet (10 mg total) by mouth 2 (two) times daily.   VITAMIN D PO Take by mouth.   VITAMIN E PO Take by mouth.   acetaminophen (TYLENOL) 325 MG tablet Take by mouth. (Patient not taking: Reported on 09/26/2021)   Docusate Sodium (DSS) 100 MG CAPS Take by mouth. (Patient not taking: Reported on 09/26/2021)   ondansetron (ZOFRAN) 4 MG tablet Take 4 mg by mouth every 8 (eight) hours as needed. (Patient not taking: Reported on 09/26/2021)   psyllium ([employer] DAILY FIBER) 58.6 % packet Take by mouth. (Patient not taking: Reported on 09/26/2021)   [DISCONTINUED] cephALEXin (KEFLEX) 500 MG capsule Take 500 mg by mouth 3 (three) times daily. (Patient not taking: Reported on 09/26/2021)   No facility-administered encounter medications on file as of 09/26/2021.     ONCOLOGIC FAMILY HISTORY:  Family History  Problem Relation Age of Onset   Hypertension Mother    Hypertension Father    Breast cancer Maternal Aunt        dx after 31   Diabetes Paternal Aunt    Diabetes Paternal Uncle     SOCIAL HISTORY:  Social History   Socioeconomic History   Marital status: Single    Spouse name: Not on file   Number of children: Not on file   Years of education: Not on file   Highest education level: Not on file  Occupational History   Not on file  Tobacco Use   Smoking status: Never   Smokeless tobacco: Never  Vaping Use   Vaping Use: Never used  Substance and Sexual Activity   Alcohol use: Yes    Comment: occasionally   Drug use: Never   Sexual activity: Not Currently    Partners: Male    Birth control/protection: Surgical    Comment: 1st intercourse- 21, partners- 2, btl, hysterectomy  Other Topics Concern   Not on file  Social History Narrative   Not on file   Social Determinants of Health    Financial Resource Strain: High Risk (03/13/2021)   Overall Financial Resource Strain (CARDIA)    Difficulty of Paying Living Expenses: Hard  Food Insecurity: Food Insecurity Present (03/13/2021)   Hunger Vital Sign    Worried About Running Out of Food in the Last Year: Sometimes true    Ran Out of Food in the Last Year: Not on file  Transportation Needs: No Transportation Needs (03/13/2021)   PRAPARE - Hydrologist (Medical): No    Lack of Transportation (Non-Medical): No  Physical Activity: Not on file  Stress: Not on file  Social Connections: Not on file  Intimate Partner Violence: Not on file     OBSERVATIONS/OBJECTIVE:  BP (!) 150/67 (BP Location: Left Arm, Patient Position: Sitting)   Pulse 61   Temp 97.7 F (36.5 C) (Temporal)   Resp 16   Ht '4\' 11"'  (1.499 m)   Wt 149 lb 6.4 oz (67.8 kg)   LMP 04/17/2021 (Exact Date)   SpO2 100%   BMI 30.18 kg/m  GENERAL: Patient is a well appearing female in no acute distress HEENT:  Sclerae anicteric.  Oropharynx clear and moist. No ulcerations or evidence of oropharyngeal candidiasis. Neck is supple.  NODES:  No cervical, supraclavicular, or axillary lymphadenopathy palpated.  BREAST EXAM:  Deferred. LUNGS:  Clear to auscultation bilaterally.  No wheezes or rhonchi. HEART:  Regular rate and rhythm. No murmur appreciated. ABDOMEN:  Soft, nontender.  Positive, normoactive bowel sounds. No organomegaly palpated. MSK:  No focal spinal tenderness to palpation. Full range of motion bilaterally in the upper extremities. EXTREMITIES:  No peripheral edema.   SKIN:  Clear with no obvious rashes or skin changes. No nail dyscrasia. NEURO:  Nonfocal. Well oriented.  Appropriate affect.   LABORATORY DATA:  None for this visit.  DIAGNOSTIC IMAGING:  None for this visit.      ASSESSMENT AND PLAN:  Ms.. Helen Mccarthy is a pleasant 51 y.o. female with Stage 0 left breast DCIS, ER+/PR+, diagnosed in 02/2021,  treated with lumpectomy, adjuvant radiation therapy, and anti-estrogen therapy with Tamoxifen beginning in 06/2021.  She presents to the Survivorship Clinic for our initial meeting and routine follow-up post-completion of treatment for breast cancer.    1. Stage 0 left breast cancer:  [name] is continuing to recover from definitive treatment for breast cancer. She will follow-up with her medical oncologist, Dr. Lindi Adie in 6 months with history and physical exam per surveillance protocol.  She will continue her anti-estrogen therapy with tamoxifen. Thus far, she is tolerating the tamoxifen well, with minimal side effects other than hot flashes which occur mainly  at night but are tolerable.  I reviewed that these a lot of times will get better with a little bit more time. Her mammogram is due 02/2022; orders placed today. Today, a comprehensive survivorship care plan and treatment summary was reviewed with the patient today detailing her breast cancer diagnosis, treatment course, potential late/long-term effects of treatment, appropriate follow-up care with recommendations for the future, and patient education resources.  A copy of this summary, along with a letter will be sent to the patient's primary care provider via mail/fax/In Basket message after today's visit.    2. Bone health:   She was given education on specific activities to promote bone health.  3. Cancer screening:  Due to [name]'s history and her age, she should receive screening for skin cancers, colon cancer, and gynecologic cancers.  The information and recommendations are listed on the patient's comprehensive care plan/treatment summary and were reviewed in detail with the patient.    4. Health maintenance and wellness promotion: Ms. Miaisabella Bacorn was encouraged to consume 5-7 servings of fruits and vegetables per day. We reviewed the "Nutrition Rainbow" handout.  She was also encouraged to engage in moderate  to vigorous exercise for 30 minutes per day most days of the week. We discussed the [employer] fitness program, which is designed for cancer survivors to help them become more physically fit after cancer treatments.  She was instructed to limit her alcohol consumption and continue to abstain from tobacco use.     5. Support services/counseling: It is not uncommon for this period of the patient's cancer care trajectory to be one of many emotions and stressors.  She was given information regarding our available services and encouraged to contact me with any questions or for help enrolling in any of our support group/programs.    Follow up instructions:    -Return to cancer center 6 months  -Mammogram due in 02/2022 -Follow up with surgery 1 year -She is welcome to return back to the Survivorship Clinic at any time; no additional follow-up needed at this time.  -Consider referral back to survivorship as a long-term survivor for continued surveillance  The patient was provided an opportunity to ask questions and all were answered. The patient agreed with the plan and demonstrated an understanding of the instructions.   Total encounter time:40 minutes*in face-to-face visit time, chart review, lab review, care coordination, order entry, and documentation of the encounter time.    Wilber Bihari, NP 09/26/21 10:24 AM Medical Oncology and Hematology [facility] [place], [place] [address] Tel. [phone]    Fax. [phone]  *Total Encounter Time as defined by the Centers for Medicare and Medicaid Services includes, in addition to the face-to-face time of a patient visit (documented in the note above) non-face-to-face time: obtaining and reviewing outside history, ordering and reviewing medications, tests or procedures, care coordination (communications with other health care professionals or caregivers) and documentation in the medical record.

## 2021-10-03 ENCOUNTER — Other Ambulatory Visit: Payer: Self-pay

## 2021-10-03 DIAGNOSIS — Z853 Personal history of malignant neoplasm of breast: Secondary | ICD-10-CM

## 2022-02-27 ENCOUNTER — Ambulatory Visit
Admission: RE | Admit: 2022-02-27 | Discharge: 2022-02-27 | Disposition: A | Discharge status: Home / Self Care | Payer: No Typology Code available for payment source | Source: Ambulatory Visit | Attending: Obstetrics and Gynecology | Admitting: Obstetrics and Gynecology

## 2022-02-27 ENCOUNTER — Ambulatory Visit: Payer: Self-pay | Admitting: Hematology and Oncology

## 2022-02-27 VITALS — BP 177/86 | Wt 163.0 lb

## 2022-02-27 DIAGNOSIS — D0512 Intraductal carcinoma in situ of left breast: Secondary | ICD-10-CM

## 2022-02-27 DIAGNOSIS — Z853 Personal history of malignant neoplasm of breast: Secondary | ICD-10-CM

## 2022-02-27 NOTE — Progress Notes (Signed)
[name] is a 52 y.o. female who presents to [facility] clinic today with no complaints.    Pap Smear: Pap not smear completed today. Last Pap smear was 2022 and was normal. Per patient has no history of an abnormal Pap smear. Last Pap smear result is available in Epic.   Physical exam: Breasts Breasts symmetrical. No skin abnormalities bilateral breasts. No nipple retraction bilateral breasts. No nipple discharge bilateral breasts. No lymphadenopathy. No lumps palpated bilateral breasts.    MM Breast Surgical Specimen  Result Date: 04/17/2021 CLINICAL DATA:  Status post seed localized lumpectomy of the LEFT breast. EXAM: SPECIMEN RADIOGRAPH OF THE LEFT BREAST COMPARISON:  Previous exam(s). FINDINGS: Status post excision of the left coil shaped breast. The radioactive seed and biopsy marker clip are present and intact. Findings are discussed with the operating room nurse at time of interpretation. IMPRESSION: Specimen radiograph of the left breast. Electronically Signed   By: Nolon Nations M.D.   On: 04/17/2021 09:14  MM LT RADIOACTIVE SEED LOC MAMMO GUIDE  Result Date: 04/16/2021 CLINICAL DATA:  Patient presents for seed localization of the LEFT breast. Recent diagnosis of ductal carcinoma in situ. EXAM: MAMMOGRAPHIC GUIDED RADIOACTIVE SEED LOCALIZATION OF THE LEFT BREAST COMPARISON:  Previous exam(s). FINDINGS: Patient presents for radioactive seed localization prior to lumpectomy. I met with the patient and we discussed the procedure of seed localization including benefits and alternatives. We discussed the high likelihood of a successful procedure. We discussed the risks of the procedure including infection, bleeding, tissue injury and further surgery. We discussed the low dose of radioactivity involved in the procedure. Informed, written consent was given. The usual time-out protocol was performed immediately prior to the procedure. Using mammographic guidance, sterile technique,  1% lidocaine and an I-125 radioactive seed, the coil shaped clip was localized using a LATERAL to MEDIAL approach. The follow-up mammogram images confirm the seed in the expected location and were marked for Dr. Ninfa Linden. Follow-up survey of the patient confirms presence of the radioactive seed. Order number of I-125 seed:  [id number]. Total activity:  0000000 millicuries reference Date: 04/03/2021 The patient tolerated the procedure well and was released from the [facility]. She was given instructions regarding seed removal. IMPRESSION: Radioactive seed localization of the LEFT breast. No apparent complications. Electronically Signed   By: Nolon Nations M.D.   On: 04/16/2021 10:35  MM CLIP PLACEMENT LEFT  Result Date: 03/06/2021 CLINICAL DATA:  Status post ultrasound-guided core biopsy of LEFT axillary lymph node. EXAM: 3D DIAGNOSTIC LEFT MAMMOGRAM POST ULTRASOUND BIOPSY COMPARISON:  Previous exam(s). FINDINGS: 3D Mammographic images were obtained following ultrasound guided biopsy of enlarged LOWER LEFT axillary lymph node and placement of a tri bell clip. The biopsy marking clip is in expected position at the site of biopsy. IMPRESSION: Appropriate positioning of the tri bell shaped biopsy marking clip at the site of biopsy in the LEFT axilla. Final Assessment: Post Procedure Mammograms for Marker Placement Electronically Signed   By: Nolon Nations M.D.   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 This was found to be concordant by Dr. Ammie Ferrier. Pathology revealed FIBROEPITHELIAL NEOPLASM,  FAVOR FIBROADENOMA (SEE  MICROSCOPIC COMMENT) ADJACENT FIBROCYSTIC CHANGES - NEGATIVE FOR MICROCALCIFICATIONS of the LEFT breast, 2 o'clock, 7cmfn ( ribbon clip). This was found to be concordant by Dr. Ammie Ferrier, with surgical consultation for excision recommended. Microscopic Comment: The left breast mass at 2:00 7 cm from the nipple showed lobulated nodular cores of breast with a fibrous and fibromyxoid stroma containing scattered angulated compressed branching ducts lined by a hyperplastic ductal epithelium. This stroma shows a variable but focally increased cellularity with periductal condensation. Although a fibroadenoma is favored a phyllodes tumor cannot be absolutely excluded. An excision is recommended. Pathology results were discussed with the patient by telephone with Bobetta Lime Bilingual Patient Services Representative. The patient reported doing well after the biopsies with tenderness at the sites. Post biopsy instructions and care were reviewed and questions were answered. The patient was encouraged to call [facility] for any additional concerns. Emotional support given. The patient was referred to [facility] on March 13, 2021. Patient contacted by Kathrine Haddock Bilingual Patient Services Representative on 03/05/2021 to give her recommended appointment for LEFT axillary ultrasound biopsy scheduled for 03/06/2021 @ 12:30 At the [facility]. Pathology results reported by Stacie Acres RN on 03/05/2021. Electronically Signed   By: Ammie Ferrier M.D.   On: 03/05/2021 15:10   Addendum Date: 03/04/2021   ADDENDUM REPORT: 03/04/2021 08:22 ADDENDUM: Correction to the exam section: The exam performed was a left stereotactic core needle biopsy. Electronically Signed   By: Ammie Ferrier M.D.   On: 03/04/2021 08:22   Result Date: 03/05/2021 CLINICAL DATA:   52 year old female presenting for stereotactic biopsy of left breast calcifications. EXAM: RIGHT BREAST STEREOTACTIC CORE NEEDLE BIOPSY COMPARISON:  Previous exams. FINDINGS: The patient and I discussed the procedure of stereotactic-guided biopsy including benefits and alternatives. We discussed the high likelihood of a successful procedure. We discussed the risks of the procedure including infection, bleeding, tissue injury, clip migration, and inadequate sampling. Informed written consent was given. The usual time out protocol was performed immediately prior to the procedure. Using sterile technique and 1% Lidocaine as local anesthetic, under stereotactic guidance, a 9 gauge vacuum assisted device was used to perform core needle biopsy of calcifications in the upper-outer quadrant of the left breast using a superior approach. Specimen radiograph was performed showing calcifications in multiple core samples. Specimens with calcifications are identified for pathology. Lesion quadrant: Upper outer quadrant At the conclusion of the procedure, a coil shaped tissue marker clip was deployed into the biopsy cavity. Follow-up 2-view mammogram was performed and dictated separately. IMPRESSION: Stereotactic-guided biopsy of calcifications in the upper-outer left breast. No apparent complications. Electronically Signed: By: Ammie Ferrier M.D. On: 03/01/2021 14:13  MM CLIP PLACEMENT LEFT  Result Date: 03/01/2021 CLINICAL DATA:  Post biopsy mammogram of the left breast for clip placement. EXAM: 3D DIAGNOSTIC LEFT MAMMOGRAM POST STEREOTACTIC AND ULTRASOUND BIOPSIES COMPARISON:  Previous exam(s). FINDINGS: 3D Mammographic images were obtained following stereotactic guided biopsy of left breast calcifications and ultrasound-guided biopsy of a left breast mass the biopsy marking clips are in expected position at the sites of biopsy. IMPRESSION: 1. Appropriate positioning of the coil shaped biopsy marking clip at the site of  biopsy in the upper-outer anterior left breast. 2. Appropriate positioning of the ribbon shaped biopsy marking clip at the site of biopsy in the upper-outer posterior left breast. Final Assessment: Post Procedure Mammograms for Marker Placement Electronically Signed  By: Ammie Ferrier M.D.   On: 03/01/2021 14:26  MM DIAG BREAST TOMO BILATERAL  Result Date: 02/22/2021 CLINICAL DATA:  Delayed screening recall for left breast calcifications. She was recalled in August of 2021, but was lost to follow-up until this time. EXAM: DIGITAL DIAGNOSTIC BILATERAL MAMMOGRAM WITH TOMOSYNTHESIS AND CAD; ULTRASOUND LEFT BREAST LIMITED TECHNIQUE: Bilateral digital diagnostic mammography and breast tomosynthesis was performed. The images were evaluated with computer-aided detection.; Targeted ultrasound examination of the left breast was performed. COMPARISON:  Previous exam(s). ACR Breast Density Category c: The breast tissue is heterogeneously dense, which may obscure small masses. FINDINGS: Spot compression magnification images through the upper outer quadrant of the left breast demonstrates a 1.1 cm group of punctate and amorphous calcifications. In the lateral posterior left breast there is an obscured oval mass. No suspicious calcifications, masses or areas of distortion are seen in the right breast. Ultrasound of the left breast at 2 o'clock, 7 cm from the nipple demonstrates a hypoechoic oval mass with slightly indistinct margins measuring 1.7 x 1.0 x 1.5 cm. Ultrasound of the left axilla demonstrates 1 lymph node with borderline cortex of 3 mm. IMPRESSION: 1. Indeterminate 1.1 cm group of calcifications in the upper-outer left breast. 2.  Indeterminate 1.7 cm mass in the left breast at 2 o'clock. 3. There is 1 borderline enlarged lymph node in the left axilla with cortex measuring just over 3 mm. 4.  No evidence of malignancy in the right breast. RECOMMENDATION: 1. Stereotactic biopsy is recommended for the  calcifications in the left breast. 2. Ultrasound-guided biopsy is recommended for the left breast mass at 2 o'clock. We will assist to schedule the patient for the procedures at our earliest mutual convenience. 3. If either of the above biopsies are malignant, ultrasound-guided biopsy of the left axillary lymph node is recommended. If all is benign, no further follow-up is necessary. I have discussed the findings and recommendations with the patient. If applicable, a reminder letter will be sent to the patient regarding the next appointment. BI-RADS CATEGORY  4: Suspicious. Electronically Signed   By: Ammie Ferrier M.D.   On: 02/22/2021 13:24      Pelvic/Bimanual Pap is not indicated today    Smoking History: Patient has never smoked and was not referred to quit line.    Patient Navigation: Patient education provided. Access to services provided for patient through [place] interpreter provided. No transportation provided   Colorectal Cancer Screening: Per patient has never had colonoscopy completed No complaints today.    Breast and Cervical Cancer Risk Assessment: Patient has family history of breast cancer, with maternal aunt. Patient does not have history of cervical dysplasia, immunocompromised, or DES exposure in-utero.  Risk Assessment   No risk assessment data     A: B[facility]CP exam without pap smear No complaints with known left breast DCIS for follow up diagnostic imaging.   P: Referred patient to the [facility] for a diagnostic mammogram. Appointment scheduled 02/27/21.  Melodye Ped, NP 02/27/2022 9:23 AM

## 2022-02-27 NOTE — Patient Instructions (Signed)
[place] about breast self awareness and gave educational materials to take home. Patient did not need a Pap smear today due to last Pap smear was in 2022 per patient. Let her know B[facility]CP will cover Pap smears every 5 years unless has a history of abnormal Pap smears. Referred patient to the [facility] for diagnostic mammogram. Appointment scheduled for 02/27/22. Patient aware of appointment and will be there. Let patient know will follow up with her within the next couple weeks with results. Helen Mccarthy verbalized understanding.  Melodye Ped, NP 9:29 AM

## 2022-03-21 NOTE — Progress Notes (Signed)
Patient Care Team: [name], MD as PCP - General (Obstetrics and Gynecology) Coralie Keens, MD as Consulting Physician (General Surgery) Nicholas Lose, MD as Consulting Physician (Hematology and Oncology) Kyung Rudd, MD as Consulting Physician (Radiation Oncology)  DIAGNOSIS: No diagnosis found.  SUMMARY OF ONCOLOGIC HISTORY: Oncology History  Ductal carcinoma in situ (DCIS) of left breast  03/06/2021 Initial Diagnosis   Left breast calcifications and diagnostic mammogram: 1.1 cm, additional mass at 2:00: 1.7 cm (fibroadenoma).  The calcifications and biopsy came back intermediate grade DCIS ER 95% PR 100%, 1 axillary lymph node was biopsied and was benign concordant   03/13/2021 Cancer Staging   Staging form: Breast, AJCC 8th Edition - Clinical: Stage 0 (cTis (DCIS), cN0, cM0, G2, ER+, PR+, HER2: Not Assessed) - Signed by Nicholas Lose, MD on 03/13/2021 Stage prefix: Initial diagnosis Histologic grading system: 3 grade system   03/21/2021 Genetic Testing   Negative hereditary cancer genetic testing: no pathogenic variants detected by [employer] BRCAPlus Panel or CustomNext-Cancer +RNAinsight.  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  The report dates are March 21, 2021 and March 27, 2021.   The BRCAplus panel offered by [employer] and includes sequencing and deletion/duplication analysis for the following 8 genes: ATM, BRCA1, BRCA2, CDH1, CHEK2, PALB2, PTEN, and TP53.  The CustomNext-Cancer+RNAinsight panel offered by [employer] [facility] includes sequencing and rearrangement analysis for the following 47 genes:  APC, ATM, AXIN2, BARD1, BMPR1A, BRCA1, BRCA2, BRIP1, CDH1, CDK4, CDKN2A, CHEK2, DICER1, EPCAM, GREM1, HOXB13, MEN1, MLH1, MSH2, MSH3, MSH6, MUTYH, NBN, NF1, NF2, NTHL1, PALB2, PMS2, POLD1, POLE, PTEN, RAD51C, RAD51D, RECQL, RET, SDHA, SDHAF2, SDHB, SDHC, SDHD, SMAD4, SMARCA4, STK11, TP53, TSC1, TSC2, and VHL.  RNA data is routinely analyzed for  use in variant interpretation for all genes.   04/17/2021 Surgery   Left lumpectomy: Residual DCIS not identified.  Focal ALH, fibrocystic changes   05/24/2021 - 06/20/2021 Radiation Therapy   Site Technique Total Dose (Gy) Dose per Fx (Gy) Completed Fx Beam Energies  Breast, Left: Breast_L 3D 42.56/42.56 2.66 16/16 6X  Breast, Left: Breast_L_Bst 3D 8/8 2 4/4 6X     06/25/2021 -  Anti-estrogen oral therapy   10 mg Tamoxifen x 5 years     CHIEF COMPLIANT: Follow-up DCIS of the left breast  INTERVAL HISTORY: Helen Mccarthy is a 52 y.o. female is here because of a diagnosis of DCIS of the left breast. She presents to the clinic today for a follow-up.   ALLERGIES:  has No Known Allergies.  MEDICATIONS:  Current Outpatient Medications  Medication Sig Dispense Refill   acetaminophen (TYLENOL) 325 MG tablet Take by mouth. (Patient not taking: Reported on 09/26/2021)     amLODipine (NORVASC) 5 MG tablet Take 5 mg by mouth daily. (Patient not taking: Reported on 02/27/2022)     Ascorbic Acid (VITAMIN C PO) Take by mouth.     calcium carbonate (OSCAL) 1500 (600 Ca) MG TABS tablet Take by mouth 2 (two) times daily with a meal.     ferrous sulfate 325 (65 FE) MG tablet Take by mouth.     GARLIC OIL PO Take by mouth.     Glucosamine HCl (GLUCOSAMINE PO) Take by mouth.     Multiple Vitamin (MULTIVITAMIN) tablet Take 1 tablet by mouth daily.     Omega-3 1400 MG CAPS Take by mouth.     ondansetron (ZOFRAN) 4 MG tablet Take 4 mg by mouth every 8 (eight) hours as needed. (Patient not taking:  Reported on 09/26/2021)     psyllium ([employer] DAILY FIBER) 58.6 % packet Take by mouth. (Patient not taking: Reported on 09/26/2021)     tamoxifen (NOLVADEX) 10 MG tablet Take 1 tablet (10 mg total) by mouth 2 (two) times daily. 90 tablet 4   VITAMIN D PO Take by mouth.     VITAMIN E PO Take by mouth.     No current facility-administered medications for this visit.    PHYSICAL EXAMINATION: ECOG  PERFORMANCE STATUS: {CHL ONC ECOG [phone]}  There were no vitals filed for this visit. There were no vitals filed for this visit.  BREAST:*** No palpable masses or nodules in either right or left breasts. No palpable axillary supraclavicular or infraclavicular adenopathy no breast tenderness or nipple discharge. (exam performed in the presence of a chaperone)  LABORATORY DATA:  I have reviewed the data as listed    Latest Ref Rng & Units 03/13/2021    8:27 AM 12/13/2020    9:53 AM 05/27/2017    2:40 PM  CMP  Glucose 70 - 99 mg/dL 112  90  95   BUN 6 - 20 mg/dL '13  12  9   '$ Creatinine 0.44 - 1.00 mg/dL 0.74  0.48  0.61   Sodium 135 - 145 mmol/L 137  141  138   Potassium 3.5 - 5.1 mmol/L 4.0  3.8  3.8   Chloride 98 - 111 mmol/L 106  107  101   CO2 22 - 32 mmol/L '23  28  29   '$ Calcium 8.9 - 10.3 mg/dL 10.0  8.8  9.5   Total Protein 6.5 - 8.1 g/dL 8.2  6.4  7.2   Total Bilirubin 0.3 - 1.2 mg/dL 0.4  0.3  0.4   Alkaline Phos 38 - 126 U/L 89     AST 15 - 41 U/L '22  20  24   '$ ALT 0 - 44 U/L '18  12  18     '$ Lab Results  Component Value Date   WBC 20.2 (H) 05/01/2021   HGB 12.9 05/01/2021   HCT 38.8 05/01/2021   MCV 74.2 (L) 05/01/2021   PLT 382 05/01/2021   NEUTROABS 16.0 (H) 03/13/2021    ASSESSMENT & PLAN:  No problem-specific Assessment & Plan notes found for this encounter.    No orders of the defined types were placed in this encounter.  The patient has a good understanding of the overall plan. she agrees with it. she will call with any problems that may develop before the next visit here. Total time spent: 30 mins including face to face time and time spent for planning, charting and co-ordination of care   Suzzette Righter, [place] 03/21/22    I Gardiner Coins am acting as a Education administrator for [employer]  ***

## 2022-03-27 ENCOUNTER — Other Ambulatory Visit: Payer: Self-pay

## 2022-03-27 ENCOUNTER — Telehealth: Payer: Self-pay | Admitting: Hematology and Oncology

## 2022-03-27 ENCOUNTER — Inpatient Hospital Stay
Payer: No Typology Code available for payment source | Attending: Hematology and Oncology | Admitting: Hematology and Oncology

## 2022-03-27 VITALS — BP 154/57 | HR 93 | Temp 97.2°F | Resp 18 | Ht 59.0 in | Wt 162.2 lb

## 2022-03-27 DIAGNOSIS — Z923 Personal history of irradiation: Secondary | ICD-10-CM | POA: Insufficient documentation

## 2022-03-27 DIAGNOSIS — C50412 Malignant neoplasm of upper-outer quadrant of left female breast: Secondary | ICD-10-CM | POA: Insufficient documentation

## 2022-03-27 DIAGNOSIS — Z170 Estrogen receptor positive status [ER+]: Secondary | ICD-10-CM | POA: Insufficient documentation

## 2022-03-27 DIAGNOSIS — D0512 Intraductal carcinoma in situ of left breast: Secondary | ICD-10-CM

## 2022-03-27 DIAGNOSIS — Z79810 Long term (current) use of selective estrogen receptor modulators (SERMs): Secondary | ICD-10-CM | POA: Insufficient documentation

## 2022-03-27 DIAGNOSIS — C787 Secondary malignant neoplasm of liver and intrahepatic bile duct: Secondary | ICD-10-CM | POA: Insufficient documentation

## 2022-03-27 MED ORDER — TAMOXIFEN CITRATE 10 MG PO TABS: 5.0000 mg | ORAL_TABLET | Freq: Two times a day (BID) | ORAL | 4 refills | Status: DC

## 2022-03-27 NOTE — Telephone Encounter (Signed)
Per 3/14 LOS reached out to schedule patient, left voicemail.

## 2022-03-27 NOTE — Assessment & Plan Note (Signed)
03/06/2021:Left breast calcifications and diagnostic mammogram: 1.1 cm, additional mass at 2:00: 1.7 cm (fibroadenoma).  The calcifications and biopsy came back intermediate grade DCIS ER 95% PR 100%, 1 axillary lymph node was biopsied and was benign concordant 04/17/2021:Left lumpectomy: Residual DCIS not identified.  Focal ALH, fibrocystic changes.   Treatment plan: 1. Adjuvant radiation therapy completed 06/20/21 2. Followed by antiestrogen therapy with tamoxifen 5 years started 06/25/21 at 10 mg ------------------------------------------------------------------------------------------------------------------------------------------ Tamoxifen toxicities:  Breast cancer surveillance: Breast exam 03/27/2022: Benign Mammogram 02/27/2022: Benign breast density category C  Return to clinic in 1 year for follow-up

## 2022-06-26 ENCOUNTER — Ambulatory Visit: Payer: Self-pay | Admitting: Obstetrics & Gynecology

## 2022-07-03 ENCOUNTER — Ambulatory Visit (INDEPENDENT_AMBULATORY_CARE_PROVIDER_SITE_OTHER): Payer: Self-pay | Admitting: Obstetrics & Gynecology

## 2022-07-03 ENCOUNTER — Encounter: Payer: Self-pay | Admitting: Obstetrics & Gynecology

## 2022-07-03 VITALS — BP 124/84 | HR 95 | Ht 58.75 in | Wt 156.0 lb

## 2022-07-03 DIAGNOSIS — Z90710 Acquired absence of both cervix and uterus: Secondary | ICD-10-CM

## 2022-07-03 DIAGNOSIS — Z01419 Encounter for gynecological examination (general) (routine) without abnormal findings: Secondary | ICD-10-CM

## 2022-07-03 DIAGNOSIS — Z9079 Acquired absence of other genital organ(s): Secondary | ICD-10-CM

## 2022-07-03 DIAGNOSIS — Z90722 Acquired absence of ovaries, bilateral: Secondary | ICD-10-CM

## 2022-07-03 DIAGNOSIS — D0512 Intraductal carcinoma in situ of left breast: Secondary | ICD-10-CM

## 2022-07-03 LAB — COMPREHENSIVE METABOLIC PANEL: AST: 24 U/L (ref 10–35)

## 2022-07-03 LAB — CBC
MCV: 74 fL — ABNORMAL LOW (ref 80.0–100.0)
RBC: 4.88 10*6/uL (ref 3.80–5.10)
RDW: 15 % (ref 11.0–15.0)

## 2022-07-03 LAB — LIPID PANEL: Total CHOL/HDL Ratio: 3.2 (calc) (ref <5.0)

## 2022-07-03 NOTE — Progress Notes (Signed)
Helen Mccarthy October 28, 1970 [id number]   History:    52 y.o.  G4P4L4 Married. S/P TL.   RP:  Established patient presenting for annual gyn exam and Rt lower abdomen/severe Dysmenorrhea/Uterine Fibroids   HPI: Postop TAH/BSO 04/30/21 for Fibroids. Patho benign. Laparotomy Lysis of Adhesions for SBO 09/04/21.  No pelvic pain. No pain with IC.  Lt Breast DCIS on Tamoxifen.  Breasts normal.  BMI 31.78. Enjoys Zumba. Urine/BMs normal. [place] 2024. [facility] here today.      Past medical history,surgical history, family history and social history were all reviewed and documented in the EPIC chart.  Gynecologic History Patient's last menstrual period was 04/17/2021 (exact date).  Obstetric History OB History  Gravida Para Term Preterm AB Living  4 4 4     4   SAB IAB Ectopic Multiple Live Births          4    # Outcome Date GA Lbr Len/2nd Weight Sex Delivery Anes PTL Lv  4 Term     F CS-Unspec  N LIV  3 Term     F CS-Unspec  N LIV  2 Term     M CS-Unspec  N LIV  1 Term     M CS-Unspec  N LIV     Birth Comments: DECEASED THREE DAYS AFTER BEING BORN-      ROS: A ROS was performed and pertinent positives and negatives are included in the history. GENERAL: No fevers or chills. HEENT: No change in vision, no earache, sore throat or sinus congestion. NECK: No pain or stiffness. CARDIOVASCULAR: No chest pain or pressure. No palpitations. PULMONARY: No shortness of breath, cough or wheeze. GASTROINTESTINAL: No abdominal pain, nausea, vomiting or diarrhea, melena or bright red blood per rectum. GENITOURINARY: No urinary frequency, urgency, hesitancy or dysuria. MUSCULOSKELETAL: No joint or muscle pain, no back pain, no recent trauma. DERMATOLOGIC: No rash, no itching, no lesions. ENDOCRINE: No polyuria, polydipsia, no heat or cold intolerance. No recent change in weight. HEMATOLOGICAL: No anemia or easy bruising or bleeding. NEUROLOGIC: No headache, seizures, numbness, tingling or  weakness. PSYCHIATRIC: No depression, no loss of interest in normal activity or change in sleep pattern.     Exam:   BP 124/84   Pulse 95   Ht 4' 10.75" (1.492 m)   Wt 156 lb (70.8 kg)   LMP 04/17/2021 (Exact Date) Comment: not sexually active  SpO2 99%   BMI 31.78 kg/m   Body mass index is 31.78 kg/m.  General appearance : Well developed well nourished female. No acute distress HEENT: Eyes: no retinal hemorrhage or exudates,  Neck supple, trachea midline, no carotid bruits, no thyroidmegaly Lungs: Clear to auscultation, no rhonchi or wheezes, or rib retractions  Heart: Regular rate and rhythm, no murmurs or gallops Breast:Examined in sitting and supine position were symmetrical in appearance, no palpable masses or tenderness,  no skin retraction, no nipple inversion, no nipple discharge, no skin discoloration, no axillary or supraclavicular lymphadenopathy Abdomen: no palpable masses or tenderness, no rebound or guarding Extremities: no edema or skin discoloration or tenderness  Pelvic: Vulva: Normal             Vagina: No gross lesions or discharge  Cervix/Uterus absent  Adnexa  Without masses or tenderness  Anus: Normal   Assessment/Plan:  52 y.o. female for annual exam   1. Well female exam with routine gynecological exam Postop TAH/BSO 04/30/21 for Fibroids. Patho benign. Laparotomy Lysis of Adhesions for SBO 09/04/21.  No pelvic pain. No pain with IC.  Lt Breast DCIS on Tamoxifen.  Breasts normal.  BMI 31.78. Enjoys Zumba. Urine/BMs normal. [facility] 2024. [facility] here today.   - CBC - Comp Met (CMET) - Lipid Profile - TSH - Vitamin D (25 hydroxy)  2. S/P TAH-BSO  3. Ductal carcinoma in situ (DCIS) of left breast On Tamoxifen.  Other orders - UNABLE TO FIND; Med Name: vitamin x po per patient - Omega-3 Fatty Acids (OMEGA 3 PO); Take by mouth. - VITAMIN D PO; Take by mouth.   Genia Del MD, 8:44 AM

## 2022-07-04 LAB — CBC
HCT: 36.1 % (ref 35.0–45.0)
Hemoglobin: 11.4 g/dL — ABNORMAL LOW (ref 11.7–15.5)
MCH: 23.4 pg — ABNORMAL LOW (ref 27.0–33.0)
MCHC: 31.6 g/dL — ABNORMAL LOW (ref 32.0–36.0)
MPV: 11.6 fL (ref 7.5–12.5)
Platelets: 333 10*3/uL (ref 140–400)
WBC: 7.7 10*3/uL (ref 3.8–10.8)

## 2022-07-04 LAB — COMPREHENSIVE METABOLIC PANEL
AG Ratio: 1.2 (calc) (ref 1.0–2.5)
ALT: 18 U/L (ref 6–29)
Albumin: 3.9 g/dL (ref 3.6–5.1)
Alkaline phosphatase (APISO): 65 U/L (ref 37–153)
BUN: 15 mg/dL (ref 7–25)
CO2: 27 mmol/L (ref 20–32)
Calcium: 9.6 mg/dL (ref 8.6–10.4)
Chloride: 104 mmol/L (ref 98–110)
Creat: 0.71 mg/dL (ref 0.50–1.03)
Globulin: 3.2 g/dL (calc) (ref 1.9–3.7)
Glucose, Bld: 90 mg/dL (ref 65–99)
Potassium: 4.1 mmol/L (ref 3.5–5.3)
Sodium: 140 mmol/L (ref 135–146)
Total Bilirubin: 0.4 mg/dL (ref 0.2–1.2)
Total Protein: 7.1 g/dL (ref 6.1–8.1)

## 2022-07-04 LAB — LIPID PANEL
Cholesterol: 247 mg/dL — ABNORMAL HIGH (ref <200)
HDL: 77 mg/dL (ref >=50)
LDL Cholesterol (Calc): 154 mg/dL (calc) — ABNORMAL HIGH
Non-HDL Cholesterol (Calc): 170 mg/dL (calc) — ABNORMAL HIGH (ref <130)
Triglycerides: 65 mg/dL (ref <150)

## 2022-07-04 LAB — TSH: TSH: 4.19 mIU/L

## 2022-07-04 LAB — VITAMIN D 25 HYDROXY (VIT D DEFICIENCY, FRACTURES): Vit D, 25-Hydroxy: 34 ng/mL (ref 30–100)

## 2022-08-09 ENCOUNTER — Other Ambulatory Visit: Payer: Self-pay | Admitting: Hematology and Oncology

## 2022-09-02 ENCOUNTER — Other Ambulatory Visit: Payer: Self-pay | Admitting: Surgery

## 2022-09-02 DIAGNOSIS — D0512 Intraductal carcinoma in situ of left breast: Secondary | ICD-10-CM

## 2022-09-02 DIAGNOSIS — N644 Mastodynia: Secondary | ICD-10-CM

## 2022-11-04 ENCOUNTER — Ambulatory Visit
Admission: RE | Admit: 2022-11-04 | Discharge: 2022-11-04 | Disposition: A | Discharge status: Home / Self Care | Payer: No Typology Code available for payment source | Source: Ambulatory Visit | Attending: Surgery | Admitting: Surgery

## 2022-11-04 ENCOUNTER — Ambulatory Visit: Payer: No Typology Code available for payment source

## 2022-11-04 DIAGNOSIS — N644 Mastodynia: Secondary | ICD-10-CM

## 2022-11-04 DIAGNOSIS — D0512 Intraductal carcinoma in situ of left breast: Secondary | ICD-10-CM

## 2022-12-28 IMAGING — [facility] MM PLC BREAST LOC DEV 1ST LESION INC MAMMO GUIDE*L*
8 of 9 series · 8 of 9 positions shown · non-contrast
Comparison: Previous exam(s).

CLINICAL DATA: Patient presents for seed localization of the LEFT
breast. Recent diagnosis of ductal carcinoma in situ.

EXAM:
MAMMOGRAPHIC GUIDED RADIOACTIVE SEED LOCALIZATION OF THE LEFT BREAST

[L CC (1 of 4)]
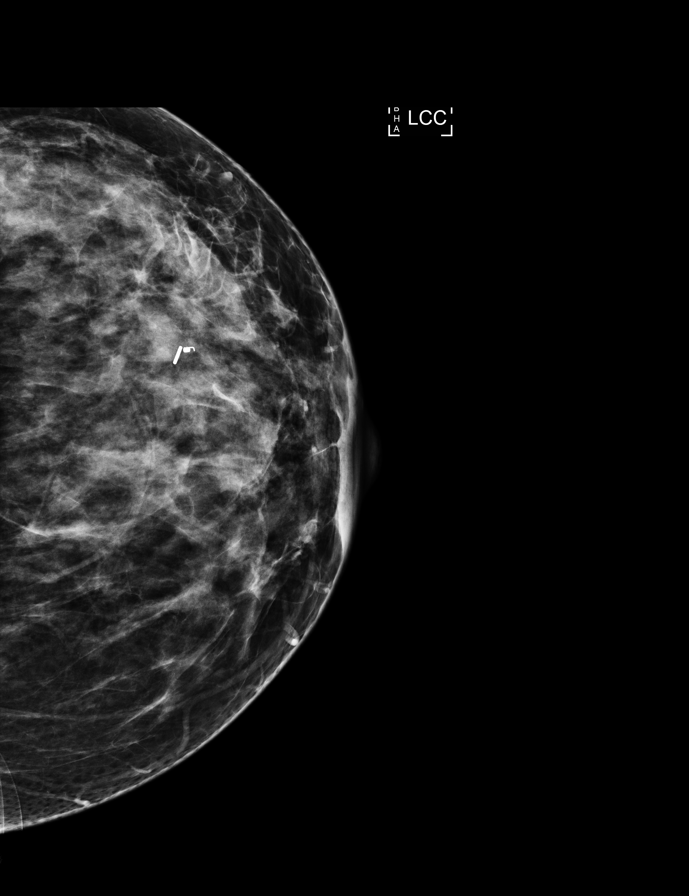

[L LM (1 of 4)]
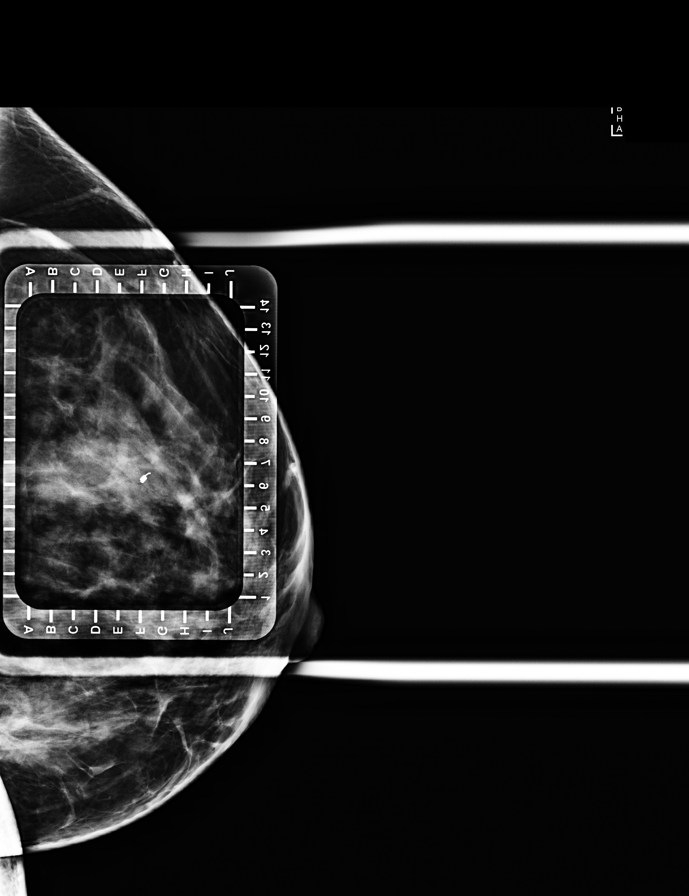

[L LM (2 of 4)]
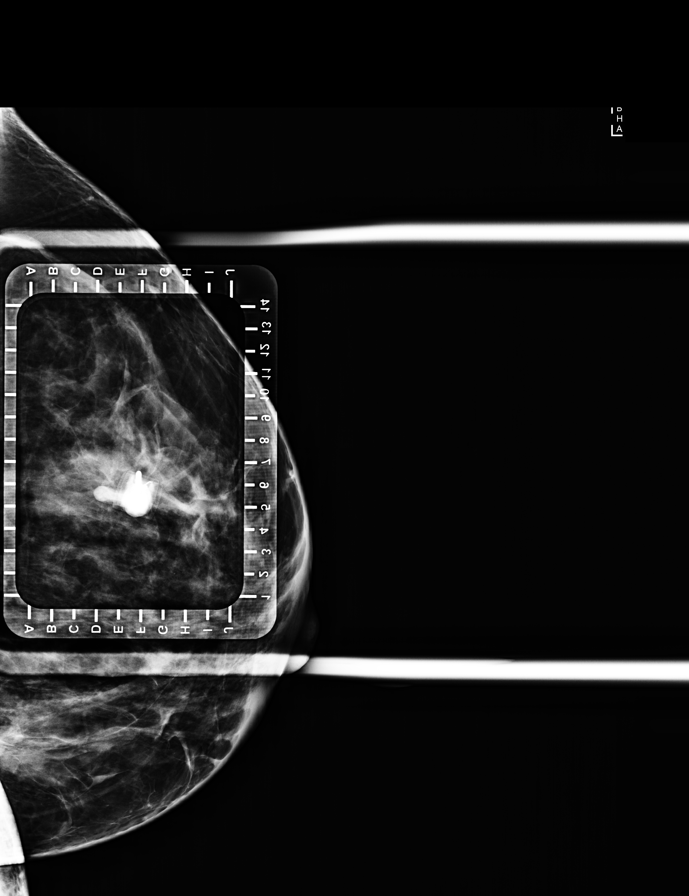

[L CC (2 of 4)]
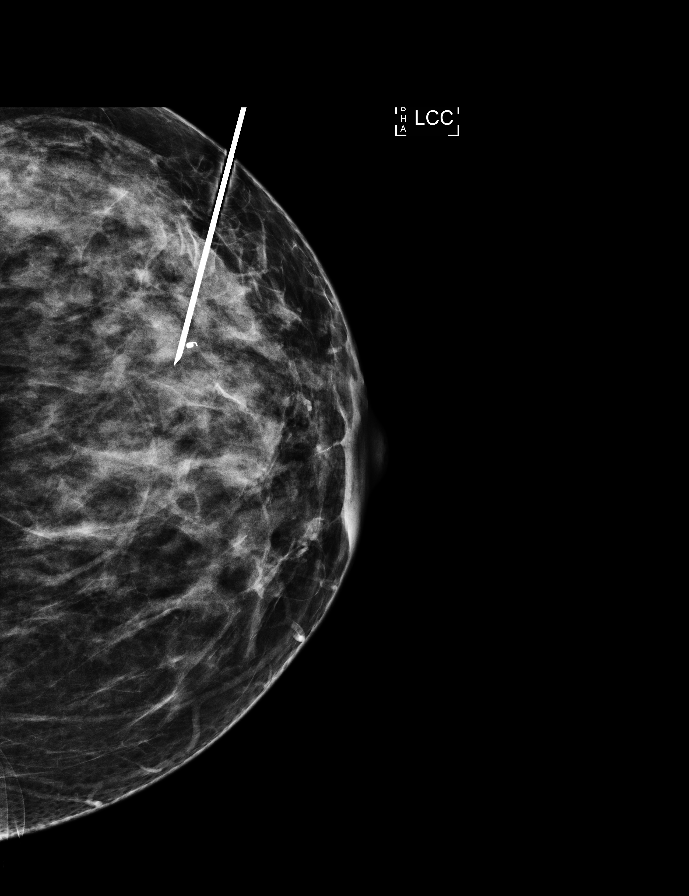

[L CC (3 of 4)]
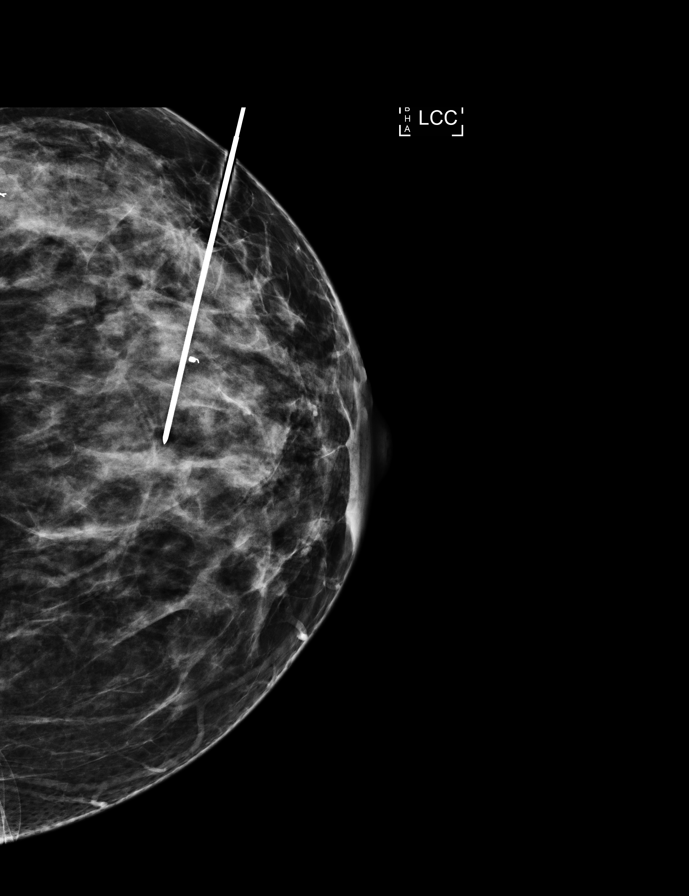

[L CC (4 of 4)]
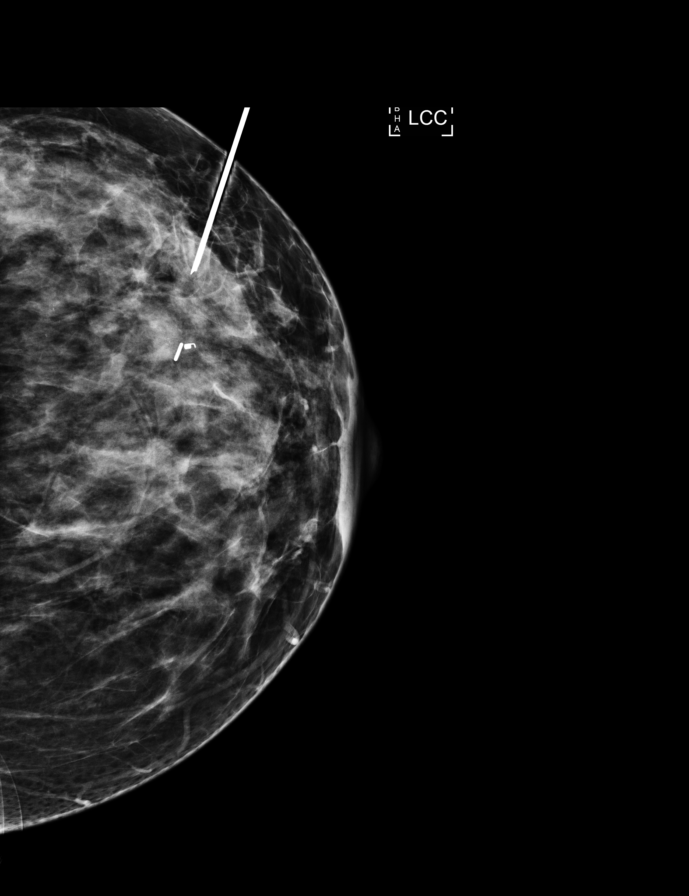

[L LM (3 of 4)]
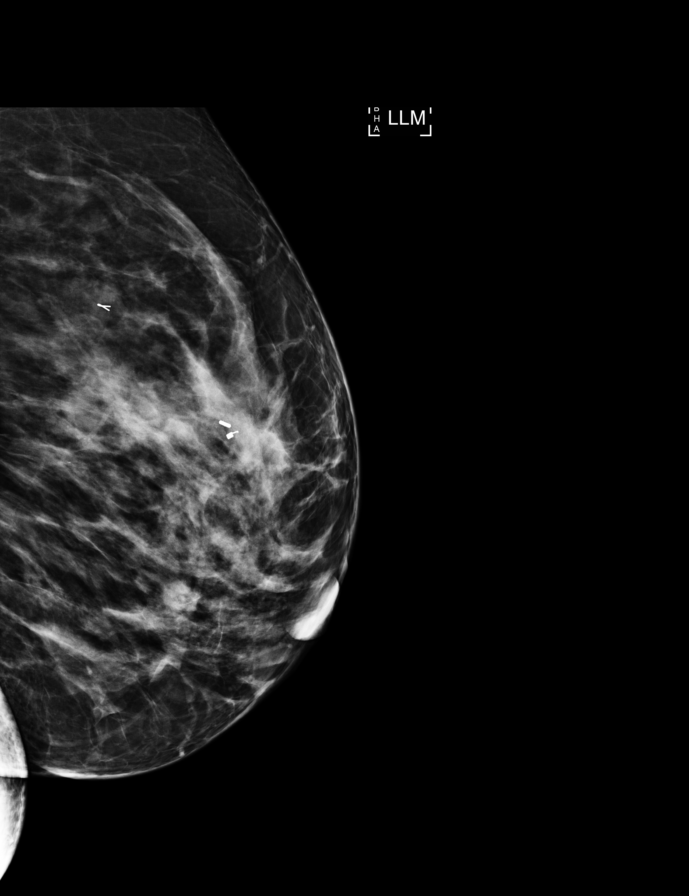

[L LM (4 of 4)]
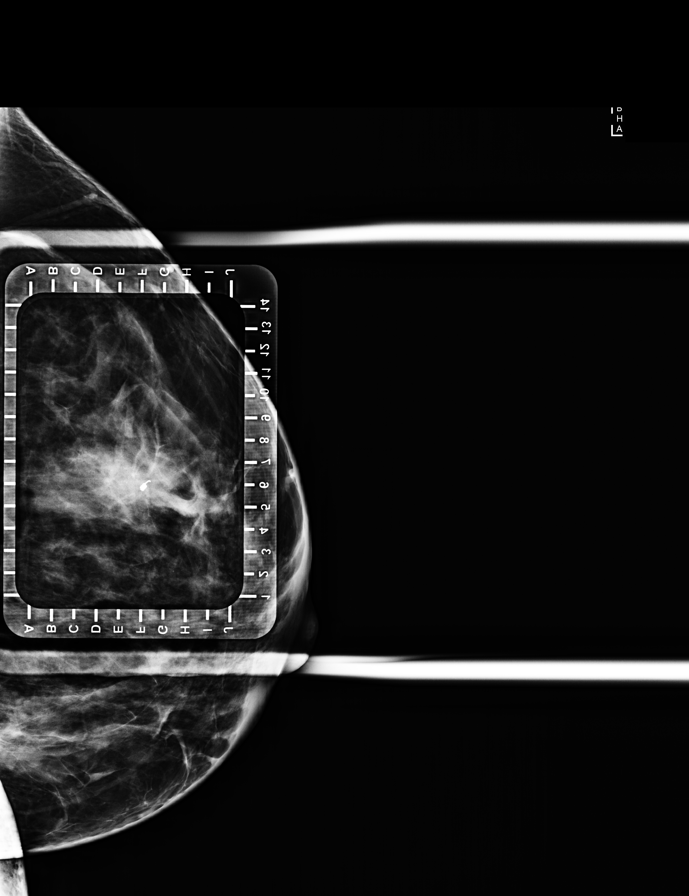

[8 of 9 positions shown; findings below may reference images not displayed]

FINDINGS: Patient presents for radioactive seed localization prior to
lumpectomy. I met with the patient and we discussed the procedure of
seed localization including benefits and alternatives. We discussed
the high likelihood of a successful procedure. We discussed the
risks of the procedure including infection, bleeding, tissue injury
and further surgery. We discussed the low dose of radioactivity
involved in the procedure. Informed, written consent was given.

The usual time-out protocol was performed immediately prior to the
procedure.

Using mammographic guidance, sterile technique, 1% lidocaine and an
V-8ND radioactive seed, the coil shaped clip was localized using a
LATERAL to MEDIAL approach. The follow-up mammogram images confirm
the seed in the expected location and were marked for Dr. Hezekiah.

Follow-up survey of the patient confirms presence of the radioactive
seed.

Order number of V-8ND seed:  [id number].

Total activity:  0.246 millicuries reference Date: 04/03/2021

The patient tolerated the procedure well and was released from the
[REDACTED]. She was given instructions regarding seed removal.
IMPRESSION: Radioactive seed localization of the LEFT breast. No apparent
complications.

## 2023-02-04 ENCOUNTER — Telehealth: Payer: Self-pay

## 2023-02-04 NOTE — Telephone Encounter (Signed)
Telephoned patient at mobile number using interpreter#[phone]. Left voice message with BCCCP contact information.

## 2023-02-10 ENCOUNTER — Other Ambulatory Visit: Payer: Self-pay

## 2023-02-10 DIAGNOSIS — D0512 Intraductal carcinoma in situ of left breast: Secondary | ICD-10-CM

## 2023-02-10 DIAGNOSIS — Z9889 Other specified postprocedural states: Secondary | ICD-10-CM

## 2023-03-24 ENCOUNTER — Ambulatory Visit: Payer: Self-pay | Admitting: *Deleted

## 2023-03-24 ENCOUNTER — Encounter: Payer: Self-pay | Admitting: Obstetrics & Gynecology

## 2023-03-24 ENCOUNTER — Ambulatory Visit
Admission: RE | Admit: 2023-03-24 | Discharge: 2023-03-24 | Disposition: A | Discharge status: Home / Self Care | Payer: Self-pay | Source: Ambulatory Visit | Attending: Obstetrics and Gynecology | Admitting: Obstetrics and Gynecology

## 2023-03-24 VITALS — BP 165/78 | Wt 152.0 lb

## 2023-03-24 DIAGNOSIS — N644 Mastodynia: Secondary | ICD-10-CM

## 2023-03-24 DIAGNOSIS — Z9889 Other specified postprocedural states: Secondary | ICD-10-CM

## 2023-03-24 DIAGNOSIS — Z1239 Encounter for other screening for malignant neoplasm of breast: Secondary | ICD-10-CM

## 2023-03-24 DIAGNOSIS — D0512 Intraductal carcinoma in situ of left breast: Secondary | ICD-10-CM

## 2023-03-24 NOTE — Patient Instructions (Signed)
 Explained breast self awareness with Earl Many Bracamontes. Patient did not need a Pap smear today due to patient has a history of a hysterectomy for benign reasons. Let patient know that she doesn't need any further Pap smears due to her history of a hysterectomy for benign reasons. Referred patient to the [facility] for a diagnostic mammogram per recommendation. Appointment scheduled Tuesday, March 24, 2023 at 1520. Patient aware of appointment and will be there. Earl Many Bracamontes verbalized understanding.  Davin Archuletta, Kathaleen Maser, RN 2:56 PM

## 2023-03-24 NOTE — Progress Notes (Signed)
 [name] is a 53 y.o. female who presents to [facility] clinic today with complaint of left outer breast pain that comes and goes x 6 months. Patient rates the pain at a 5 out of 10.    Pap Smear: Pap smear not completed today. Last Pap smear was 12/13/2020 at the [facility] and was normal. Per patient has no history of an abnormal Pap smear. Patient has a history of a hysterectomy 04/30/2021 due to fibroids. Patient doesn't need any further Pap smears due to her history of a hysterectomy for benign reasons per BCCCP and ASCCP guidelines. Last Pap smear result is available in Epic.   Physical exam: Breasts Right breast larger than left breast due to history of left breast lumpectomy for breast cancer. No skin abnormalities bilateral breasts. No nipple retraction bilateral breasts. No nipple discharge bilateral breasts. No lymphadenopathy. No lumps palpated bilateral breasts. Complaints of left outer breast pain on exam.     MS 3D DIAG MAMMO UNI LT BR (aka MM) Result Date: 11/04/2022 CLINICAL DATA:  Patient presents for left breast tenderness. EXAM: DIGITAL DIAGNOSTIC UNILATERAL LEFT MAMMOGRAM WITH TOMOSYNTHESIS AND CAD TECHNIQUE: Left digital diagnostic mammography and breast tomosynthesis was performed. The images were evaluated with computer-aided detection. COMPARISON:  Previous exam(s). ACR Breast Density Category c: The breasts are heterogeneously dense, which may obscure small masses. FINDINGS: Stable postlumpectomy changes left breast. No new masses, calcifications or nonsurgical distortion identified within the left breast. IMPRESSION: No mammographic evidence for malignancy. Stable postlumpectomy changes left breast. RECOMMENDATION: Bilateral diagnostic mammography 02/2023. I have discussed the findings and recommendations with the patient. If applicable, a reminder letter will be sent to the patient regarding the next appointment. BI-RADS CATEGORY  2:  Benign. Electronically Signed   By: Annia Belt M.D.   On: 11/04/2022 15:25   MS DIGITAL DIAG TOMO BILAT Result Date: 02/27/2022 CLINICAL DATA:  53 year old female presenting for annual exam. 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 No new problems. EXAM: DIGITAL DIAGNOSTIC BILATERAL MAMMOGRAM WITH TOMOSYNTHESIS TECHNIQUE: Bilateral digital diagnostic mammography and breast tomosynthesis was performed. COMPARISON:  Previous exam(s). ACR Breast Density Category c: The breasts are heterogeneously dense, which may obscure small masses. FINDINGS: Right breast: No suspicious mass, distortion, or microcalcifications are identified to suggest presence of malignancy. Left breast: A spot 2D magnification view of the lumpectomy site was performed in addition to standard views. There are expected postsurgical changes. No suspicious mass, distortion, or microcalcifications are identified to suggest presence of malignancy. IMPRESSION: Benign postsurgical changes in the left breast. No mammographic evidence of malignancy bilaterally. RECOMMENDATION: Diagnostic bilateral mammogram in 1 year. I have discussed the findings and recommendations with the patient. If applicable, a reminder letter will be sent to the patient regarding the next appointment. BI-RADS CATEGORY  2: Benign. Electronically Signed   By: Emmaline Kluver M.D.   On: 02/27/2022 11:21  MM Breast Surgical Specimen Result Date: 04/17/2021 CLINICAL DATA:  Status post seed localized lumpectomy of the LEFT breast. EXAM: SPECIMEN RADIOGRAPH OF THE LEFT BREAST COMPARISON:  Previous exam(s). FINDINGS: Status post excision of the left coil shaped breast. The radioactive seed and biopsy marker clip are present and intact. Findings are discussed with the operating room nurse at time of interpretation. IMPRESSION: Specimen radiograph of the left breast. Electronically Signed   By: Norva Pavlov M.D.   On: 04/17/2021 09:14  MM LT RADIOACTIVE SEED  LOC MAMMO GUIDE Result Date: 04/16/2021 CLINICAL DATA:  Patient presents for seed localization of the LEFT breast. Recent diagnosis of ductal carcinoma in situ. EXAM: MAMMOGRAPHIC GUIDED RADIOACTIVE SEED LOCALIZATION OF THE LEFT BREAST COMPARISON:  Previous exam(s). FINDINGS: Patient presents for radioactive seed localization prior to lumpectomy. I met with the patient and we discussed the procedure of seed localization including benefits and alternatives. We discussed the high likelihood of a successful procedure. We discussed the risks of the procedure including infection, bleeding, tissue injury and further surgery. We discussed the low dose of radioactivity involved in the procedure. Informed, written consent was given. The usual time-out protocol was performed immediately prior to the procedure. Using mammographic guidance, sterile technique, 1% lidocaine and an I-125 radioactive seed, the coil shaped clip was localized using a LATERAL to MEDIAL approach. The follow-up mammogram images confirm the seed in the expected location and were marked for Dr. Magnus Ivan. Follow-up survey of the patient confirms presence of the radioactive seed. Order number of I-125 seed:  [id number]. Total activity:  0.246 millicuries reference Date: 04/03/2021 The patient tolerated the procedure well and was released from the Breast Center. She was given instructions regarding seed removal. IMPRESSION: Radioactive seed localization of the LEFT breast. No apparent complications. Electronically Signed   By: Norva Pavlov M.D.   On: 04/16/2021 10:35  MM CLIP PLACEMENT LEFT Result Date: 03/06/2021 CLINICAL DATA:  Status post ultrasound-guided core biopsy of LEFT axillary lymph node. EXAM: 3D DIAGNOSTIC LEFT MAMMOGRAM POST ULTRASOUND BIOPSY COMPARISON:  Previous exam(s). FINDINGS: 3D Mammographic images were obtained following ultrasound guided biopsy of enlarged LOWER LEFT axillary lymph node and placement of a tri bell clip. The  biopsy marking clip is in expected position at the site of biopsy. IMPRESSION: Appropriate positioning of the tri bell shaped biopsy marking clip at the site of biopsy in the LEFT axilla. Final Assessment: Post Procedure Mammograms for Marker Placement Electronically Signed   By: Norva Pavlov M.D.   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 This was found to be concordant by Dr. Frederico Hamman. Pathology revealed FIBROEPITHELIAL NEOPLASM, FAVOR FIBROADENOMA (SEE MICROSCOPIC COMMENT) ADJACENT FIBROCYSTIC CHANGES - NEGATIVE FOR MICROCALCIFICATIONS of the LEFT breast, 2 o'clock, 7cmfn ( ribbon clip). This was found to be concordant by Dr. Frederico Hamman, with surgical consultation for excision recommended. Microscopic Comment: The left breast mass at 2:00 7 cm from the nipple showed lobulated nodular cores of breast with a fibrous and fibromyxoid stroma containing scattered angulated compressed branching ducts lined by a hyperplastic ductal epithelium. This stroma shows a variable but focally increased cellularity with periductal condensation. Although a fibroadenoma is favored a phyllodes tumor cannot be absolutely excluded. An excision is recommended. Pathology results were discussed with the patient by telephone with Lovena Neighbours Bilingual Patient Services Representative. The patient reported doing well after the biopsies with tenderness at the sites. Post biopsy instructions and care were reviewed and questions were answered. The patient was encouraged to call The [facility] for  any additional concerns. Emotional support given. The patient was referred to [facility] on March 13, 2021. Patient contacted by Durene Cal Bilingual Patient Services Representative on  03/05/2021 to give her recommended appointment for LEFT axillary ultrasound biopsy scheduled for 03/06/2021 @ 12:30 At the [facility]. Pathology results reported by Collene Mares RN on 03/05/2021. Electronically Signed   By: Frederico Hamman M.D.   On: 03/05/2021 15:10   Addendum Date: 03/04/2021 ADDENDUM REPORT: 03/04/2021 08:22 ADDENDUM: Correction to the exam section: The exam performed was a left stereotactic core needle biopsy. Electronically Signed   By: Frederico Hamman M.D.   On: 03/04/2021 08:22   Result Date: 03/05/2021 CLINICAL DATA:  53 year old female presenting for stereotactic biopsy of left breast calcifications. EXAM: RIGHT BREAST STEREOTACTIC CORE NEEDLE BIOPSY COMPARISON:  Previous exams. FINDINGS: The patient and I discussed the procedure of stereotactic-guided biopsy including benefits and alternatives. We discussed the high likelihood of a successful procedure. We discussed the risks of the procedure including infection, bleeding, tissue injury, clip migration, and inadequate sampling. Informed written consent was given. The usual time out protocol was performed immediately prior to the procedure. Using sterile technique and 1% Lidocaine as local anesthetic, under stereotactic guidance, a 9 gauge vacuum assisted device was used to perform core needle biopsy of calcifications in the upper-outer quadrant of the left breast using a superior approach. Specimen radiograph was performed showing calcifications in multiple core samples. Specimens with calcifications are identified for pathology. Lesion quadrant: Upper outer quadrant At the conclusion of the procedure, a coil shaped tissue marker clip was deployed  into the biopsy cavity. Follow-up 2-view mammogram was performed and dictated separately. IMPRESSION: Stereotactic-guided biopsy of calcifications in the upper-outer left breast. No apparent complications. Electronically Signed: By: Frederico Hamman M.D. On: 03/01/2021 14:13  MM CLIP PLACEMENT LEFT Result Date: 03/01/2021 CLINICAL DATA:  Post biopsy mammogram of the left breast for clip placement. EXAM: 3D DIAGNOSTIC LEFT MAMMOGRAM POST STEREOTACTIC AND ULTRASOUND BIOPSIES COMPARISON:  Previous exam(s). FINDINGS: 3D Mammographic images were obtained following stereotactic guided biopsy of left breast calcifications and ultrasound-guided biopsy of a left breast mass the biopsy marking clips are in expected position at the sites of biopsy. IMPRESSION: 1. Appropriate positioning of the coil shaped biopsy marking clip at the site of biopsy in the upper-outer anterior left breast. 2. Appropriate positioning of the ribbon shaped biopsy marking clip at the site of biopsy in the upper-outer posterior left breast. Final Assessment: Post Procedure Mammograms for Marker Placement Electronically Signed   By: Frederico Hamman M.D.   On: 03/01/2021 14:26  MM DIAG BREAST TOMO BILATERAL Result Date: 02/22/2021 CLINICAL DATA:  Delayed screening recall for left breast calcifications. She was recalled in August of 2021, but was lost to follow-up until this time. EXAM: DIGITAL DIAGNOSTIC BILATERAL MAMMOGRAM WITH TOMOSYNTHESIS AND CAD; ULTRASOUND LEFT BREAST LIMITED TECHNIQUE: Bilateral digital diagnostic mammography and breast tomosynthesis was performed. The images were evaluated with computer-aided detection.; Targeted ultrasound examination of the left breast was performed. COMPARISON:  Previous exam(s). ACR Breast Density Category c: The breast tissue is heterogeneously dense, which may obscure small masses. FINDINGS: Spot compression magnification images through the upper outer quadrant of the left breast demonstrates a  1.1 cm group of punctate and amorphous calcifications. In the lateral posterior left breast there is an obscured oval mass. No suspicious calcifications, masses or areas of distortion are seen in the right breast. Ultrasound of the left breast at 2 o'clock, 7 cm from the nipple demonstrates a hypoechoic oval mass with slightly indistinct margins measuring 1.7 x 1.0 x 1.5 cm. Ultrasound of the left axilla demonstrates 1 lymph node  with borderline cortex of 3 mm. IMPRESSION: 1. Indeterminate 1.1 cm group of calcifications in the upper-outer left breast. 2.  Indeterminate 1.7 cm mass in the left breast at 2 o'clock. 3. There is 1 borderline enlarged lymph node in the left axilla with cortex measuring just over 3 mm. 4.  No evidence of malignancy in the right breast. RECOMMENDATION: 1. Stereotactic biopsy is recommended for the calcifications in the left breast. 2. Ultrasound-guided biopsy is recommended for the left breast mass at 2 o'clock. We will assist to schedule the patient for the procedures at our earliest mutual convenience. 3. If either of the above biopsies are malignant, ultrasound-guided biopsy of the left axillary lymph node is recommended. If all is benign, no further follow-up is necessary. I have discussed the findings and recommendations with the patient. If applicable, a reminder letter will be sent to the patient regarding the next appointment. BI-RADS CATEGORY  4: Suspicious. Electronically Signed   By: Frederico Hamman M.D.   On: 02/22/2021 13:24   Pelvic/Bimanual Pap is not indicated today per BCCCP guidelines.   Smoking History: Patient has never smoked.   Patient Navigation: Patient education provided. Access to services provided for patient through [facility] program. Spanish interpreter Natale Lay from [facility] provided.   Colorectal Cancer Screening: Per patient has had colonoscopy completed on 04/08/2022 at [facility].  No complaints today.    Breast and Cervical Cancer Risk  Assessment: Patient has family history of maternal aunt having breast cancer. Patient has personal history of breast cancer. Patient has no known genetic mutations or history of radiation treatment to the chest before age 89. Patient does not have history of cervical dysplasia, immunocompromised, or DES exposure in-utero. Breast cancer risk assessment completed. No breast cancer risk calculated due to patients history of breast cancer.  Risk Assessment   No risk assessment data     A: BCCCP exam without pap smear Complaint of left outer breast pain.  P: Referred patient to the [facility] for a diagnostic mammogram per recommendation. Appointment scheduled Tuesday, March 24, 2023 at 1520.  Priscille Heidelberg, RN 03/24/2023 2:56 PM

## 2023-03-26 ENCOUNTER — Ambulatory Visit: Payer: Self-pay | Admitting: Hematology and Oncology

## 2023-03-30 ENCOUNTER — Inpatient Hospital Stay: Payer: Self-pay | Attending: Hematology and Oncology | Admitting: Hematology and Oncology

## 2023-03-30 VITALS — BP 121/71 | HR 66 | Temp 97.8°F | Resp 18 | Wt 154.2 lb

## 2023-03-30 DIAGNOSIS — D0512 Intraductal carcinoma in situ of left breast: Secondary | ICD-10-CM

## 2023-03-30 DIAGNOSIS — Z1721 Progesterone receptor positive status: Secondary | ICD-10-CM | POA: Insufficient documentation

## 2023-03-30 DIAGNOSIS — N644 Mastodynia: Secondary | ICD-10-CM | POA: Insufficient documentation

## 2023-03-30 DIAGNOSIS — Z170 Estrogen receptor positive status [ER+]: Secondary | ICD-10-CM | POA: Insufficient documentation

## 2023-03-30 DIAGNOSIS — C787 Secondary malignant neoplasm of liver and intrahepatic bile duct: Secondary | ICD-10-CM | POA: Insufficient documentation

## 2023-03-30 DIAGNOSIS — C50912 Malignant neoplasm of unspecified site of left female breast: Secondary | ICD-10-CM | POA: Insufficient documentation

## 2023-03-30 DIAGNOSIS — Z79810 Long term (current) use of selective estrogen receptor modulators (SERMs): Secondary | ICD-10-CM | POA: Insufficient documentation

## 2023-03-30 DIAGNOSIS — Z923 Personal history of irradiation: Secondary | ICD-10-CM | POA: Insufficient documentation

## 2023-03-30 MED ORDER — TAMOXIFEN CITRATE 10 MG PO TABS: 5.0000 mg | ORAL_TABLET | Freq: Every day | ORAL | 1 refills | Status: AC

## 2023-03-30 NOTE — Assessment & Plan Note (Signed)
 03/06/2021:Left breast calcifications and diagnostic mammogram: 1.1 cm, additional mass at 2:00: 1.7 cm (fibroadenoma).  The calcifications and biopsy came back intermediate grade DCIS ER 95% PR 100%, 1 axillary lymph node was biopsied and was benign concordant 04/17/2021:Left lumpectomy: Residual DCIS not identified.  Focal ALH, fibrocystic changes.   Treatment plan: 1. Adjuvant radiation therapy completed 06/20/21 2. Followed by antiestrogen therapy with tamoxifen 5 years started 06/25/21 at 10 mg ------------------------------------------------------------------------------------------------------------------------------------------ Tamoxifen toxicities: Hot flashes; I recommended reducing the dosage of tamoxifen to 5 mg a day.   Breast cancer surveillance: Mammogram 03/24/2023: Benign breast density category C Breast exam 03/30/2023: Benign   Return to clinic in 1 year for follow-up

## 2023-03-30 NOTE — Progress Notes (Signed)
 Patient Care Team: Genia Del, MD as PCP - General (Obstetrics and Gynecology) Abigail Miyamoto, MD as Consulting Physician (General Surgery) Serena Croissant, MD as Consulting Physician (Hematology and Oncology) Dorothy Puffer, MD as Consulting Physician (Radiation Oncology)  DIAGNOSIS:  Encounter Diagnosis  Name Primary?   Ductal carcinoma in situ (DCIS) of left breast Yes    SUMMARY OF ONCOLOGIC HISTORY: Oncology History  Ductal carcinoma in situ (DCIS) of left breast  03/06/2021 Initial Diagnosis   Left breast calcifications and diagnostic mammogram: 1.1 cm, additional mass at 2:00: 1.7 cm (fibroadenoma).  The calcifications and biopsy came back intermediate grade DCIS ER 95% PR 100%, 1 axillary lymph node was biopsied and was benign concordant   03/13/2021 Cancer Staging   Staging form: Breast, AJCC 8th Edition - Clinical: Stage 0 (cTis (DCIS), cN0, cM0, G2, ER+, PR+, HER2: Unknown) - Signed by Serena Croissant, MD on 03/27/2022 Stage prefix: Initial diagnosis Histologic grading system: 3 grade system   03/21/2021 Genetic Testing   Negative hereditary cancer genetic testing: no pathogenic variants detected by [employer] [facility] Panel or [facility]RNA[facility]ight.  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  The report dates are March 21, 2021 and March 27, 2021.   The BRCAplus panel offered by [employer] and includes sequencing and deletion/duplication analysis for the following 8 genes: ATM, BRCA1, BRCA2, CDH1, CHEK2, PALB2, PTEN, and TP53.  The [facility]Next-Cancer+RNAinsight panel offered by [employer] includes sequencing and rearrangement analysis for the following 47 genes:  APC, ATM, AXIN2, BARD1, BMPR1A, BRCA1, BRCA2, BRIP1, CDH1, CDK4, CDKN2A, CHEK2, DICER1, EPCAM, GREM1, HOXB13, MEN1, MLH1, MSH2, MSH3, MSH6, MUTYH, NBN, NF1, NF2, NTHL1, PALB2, PMS2, POLD1, POLE, PTEN, RAD51C, RAD51D, RECQL, RET, SDHA, SDHAF2, SDHB, SDHC, SDHD, SMAD4, SMARCA4,  STK11, TP53, TSC1, TSC2, and VHL.  RNA data is routinely analyzed for use in variant interpretation for all genes.   04/17/2021 Surgery   Left lumpectomy: Residual DCIS not identified.  Focal ALH, fibrocystic changes   05/24/2021 - 06/20/2021 Radiation Therapy   Site Technique Total Dose (Gy) Dose per Fx (Gy) Completed Fx Beam Energies  Breast, Left: Breast_L 3D 42.56/42.56 2.66 16/16 6X  Breast, Left: Breast_L_Bst 3D 8/8 2 4/4 6X     06/25/2021 -  Anti-estrogen oral therapy   10 mg Tamoxifen x 5 years     CHIEF COMPLIANT: Follow-up on tamoxifen therapy  HISTORY OF PRESENT ILLNESS:  History of Present Illness The patient, with a history of breast cancer, has been on tamoxifen for almost two years. She reports significant hot flashes, which have been ongoing for approximately three years, since starting the tamoxifen. The hot flashes do not wake her at night, and she manages the symptoms with a fan at home. She is currently taking half a tablet of tamoxifen daily, the lowest possible dose. If she forgets to take the medication one day, she takes a whole tablet the next day. She also reports occasional sharp pains in her breast, which she describes as 'little pops.' She has been told this is normal and likely due to scar tissue pulling on the nerves.     ALLERGIES:  has no known allergies.  MEDICATIONS:  Current Outpatient Medications  Medication Sig Dispense Refill   amLODipine (NORVASC) 5 MG tablet Take 5 mg by mouth daily.     GARLIC PO Garlic     Omega-3 Fatty Acids (OMEGA 3 PO) Take by mouth.     tamoxifen (NOLVADEX) 10 MG tablet Take 1 tablet by mouth  twice daily 90 tablet 0   VITAMIN D PO Take by mouth.     atorvastatin (LIPITOR) 10 MG tablet Take 10 mg by mouth daily. (Patient not taking: Reported on 03/30/2023)     Calcium Carbonate (CALCIUM 500 PO) Calcium (Patient not taking: Reported on 03/30/2023)     No current facility-administered medications for this visit.    PHYSICAL  EXAMINATION: ECOG PERFORMANCE STATUS: 1 - Symptomatic but completely ambulatory  Vitals:   03/30/23 1518  BP: 121/71  Pulse: 66  Resp: 18  Temp: 97.8 F (36.6 C)  SpO2: 100%   Filed Weights   03/30/23 1518  Weight: 154 lb 3.2 oz (69.9 kg)    Physical Exam   (exam performed in the presence of a chaperone)  LABORATORY DATA:  I have reviewed the data as listed    Latest Ref Rng & Units 07/03/2022    9:07 AM 03/13/2021    8:27 AM 12/13/2020    9:53 AM  CMP  Glucose 65 - 99 mg/dL 90  409  90   BUN 7 - 25 mg/dL 15  13  12    Creatinine 0.50 - 1.03 mg/dL 8.11  9.14  7.82   Sodium 135 - 146 mmol/L 140  137  141   Potassium 3.5 - 5.3 mmol/L 4.1  4.0  3.8   Chloride 98 - 110 mmol/L 104  106  107   CO2 20 - 32 mmol/L 27  23  28    Calcium 8.6 - 10.4 mg/dL 9.6  95.6  8.8   Total Protein 6.1 - 8.1 g/dL 7.1  8.2  6.4   Total Bilirubin 0.2 - 1.2 mg/dL 0.4  0.4  0.3   Alkaline Phos 38 - 126 U/L  89    AST 10 - 35 U/L 24  22  20    ALT 6 - 29 U/L 18  18  12      Lab Results  Component Value Date   WBC 7.7 07/03/2022   HGB 11.4 (L) 07/03/2022   HCT 36.1 07/03/2022   MCV 74.0 (L) 07/03/2022   PLT 333 07/03/2022   NEUTROABS 16.0 (H) 03/13/2021    ASSESSMENT & PLAN:  Ductal carcinoma in situ (DCIS) of left breast 03/06/2021:Left breast calcifications and diagnostic mammogram: 1.1 cm, additional mass at 2:00: 1.7 cm (fibroadenoma).  The calcifications and biopsy came back intermediate grade DCIS ER 95% PR 100%, 1 axillary lymph node was biopsied and was benign concordant 04/17/2021:Left lumpectomy: Residual DCIS not identified.  Focal ALH, fibrocystic changes.   Treatment plan: 1. Adjuvant radiation therapy completed 06/20/21 2. Followed by antiestrogen therapy with tamoxifen 5 years started 06/25/21 at 10 mg ------------------------------------------------------------------------------------------------------------------------------------------ Tamoxifen toxicities: Hot flashes; I  recommended reducing the dosage of tamoxifen to 5 mg a day.   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 Experiences hot flashes from tamoxifen, declines Effexor due to side effects. Normal mammogram, breast density C. - Continue tamoxifen at half a tablet daily. - Renew tamoxifen prescription as needed. - Continue annual mammograms. - Consider alternative medication if hot flashes worsen.  Breast pain Sharp, intermittent pain likely from scar tissue affecting nerves, worsened by cold. - Reassured pain is common and related to scar tissue. - Advised monitoring symptoms and reporting significant changes.  Goals of Care Advised continuation of tamoxifen for five years for breast cancer treatment. - Encourage adherence to tamoxifen for five years.  Follow-up Scheduled for annual follow-up visits and mammograms. - Schedule next annual follow-up visit in one year.      No orders of the defined types were placed in this encounter.  The patient has a good understanding of the overall plan. she agrees with it. she will call with any problems that may develop before the next visit here. Total time spent: 30 mins including face to face time and time spent for planning, charting and co-ordination of care   Tamsen Meek, MD 03/30/23

## 2024-03-29 ENCOUNTER — Ambulatory Visit: Admitting: Hematology and Oncology
